# Patient Record
Sex: Female | Born: 2000
Health system: Southern US, Community
[De-identification: ages and names within clinical notes are randomized; demographics above are authoritative.]

## PROBLEM LIST (undated history)

## (undated) DIAGNOSIS — F40298 Other specified phobia: Secondary | ICD-10-CM

## (undated) DIAGNOSIS — R51 Headache: Secondary | ICD-10-CM

## (undated) DIAGNOSIS — F32A Depression, unspecified: Secondary | ICD-10-CM

## (undated) DIAGNOSIS — F419 Anxiety disorder, unspecified: Secondary | ICD-10-CM

## (undated) DIAGNOSIS — R519 Headache, unspecified: Secondary | ICD-10-CM

## (undated) DIAGNOSIS — N200 Calculus of kidney: Secondary | ICD-10-CM

## (undated) DIAGNOSIS — N390 Urinary tract infection, site not specified: Secondary | ICD-10-CM

## (undated) DIAGNOSIS — T7840XA Allergy, unspecified, initial encounter: Secondary | ICD-10-CM

## (undated) HISTORY — PX: FRACTURE SURGERY: SHX138

## (undated) HISTORY — DX: Other specified phobia: F40.298

## (undated) HISTORY — DX: Headache: R51

## (undated) HISTORY — DX: Depression, unspecified: F32.A

## (undated) HISTORY — DX: Allergy, unspecified, initial encounter: T78.40XA

## (undated) HISTORY — DX: Anxiety disorder, unspecified: F41.9

## (undated) HISTORY — DX: Headache, unspecified: R51.9

---
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
## 2021-01-05 ENCOUNTER — Encounter: Payer: Self-pay | Admitting: Family Medicine

## 2021-02-02 ENCOUNTER — Telehealth (INDEPENDENT_AMBULATORY_CARE_PROVIDER_SITE_OTHER): Payer: Self-pay | Admitting: Family

## 2021-02-02 ENCOUNTER — Encounter (INDEPENDENT_AMBULATORY_CARE_PROVIDER_SITE_OTHER): Payer: Self-pay

## 2021-02-02 NOTE — Telephone Encounter (Signed)
Who's calling (name and relationship to patient) : [facility]   Best contact number: [phone]  Provider they see: Elveria Rising  Reason for call: trokendi is no longer covered by insurance plan. Wants to know if toperimate is an option.  Reference # [id number]  Call ID:      PRESCRIPTION REFILL ONLY  Name of prescription:  Pharmacy:

## 2021-02-03 NOTE — Telephone Encounter (Signed)
I called and learned that Trokendi XR requires a PA. I did the PA with [employer] and it was approved. TG

## 2021-02-03 NOTE — Telephone Encounter (Signed)
Please advise 

## 2021-04-13 IMAGING — US US RENAL
1 series · 14 of 25 positions shown · non-contrast
Comparison: CT abdomen pelvis and abdominal ultrasound dated
01/10/2020.

CLINICAL DATA: Right-sided abdominal pain, history of renal stones.

EXAM:
RENAL / URINARY TRACT ULTRASOUND COMPLETE

[Series 1: us renal · 14 of 61 slices shown]
[im 1/61]
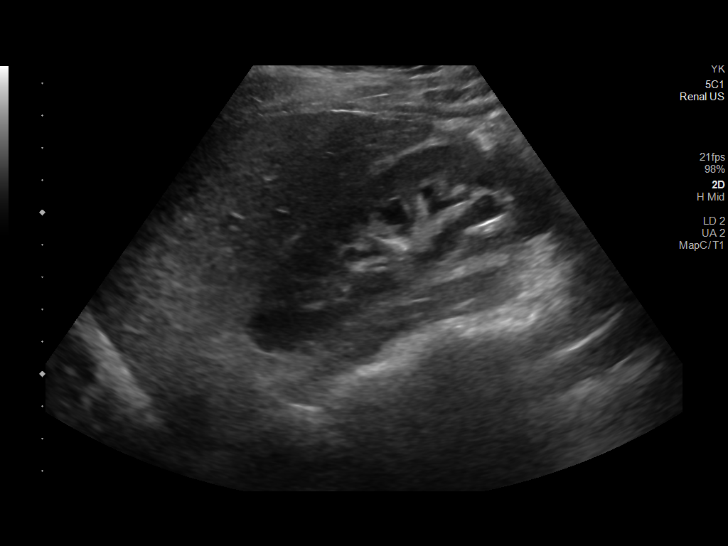
[im 6/61]
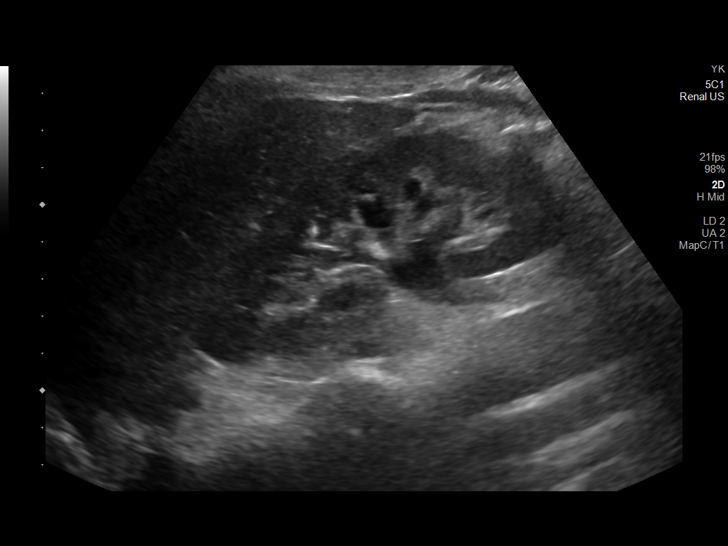
[im 11/61]
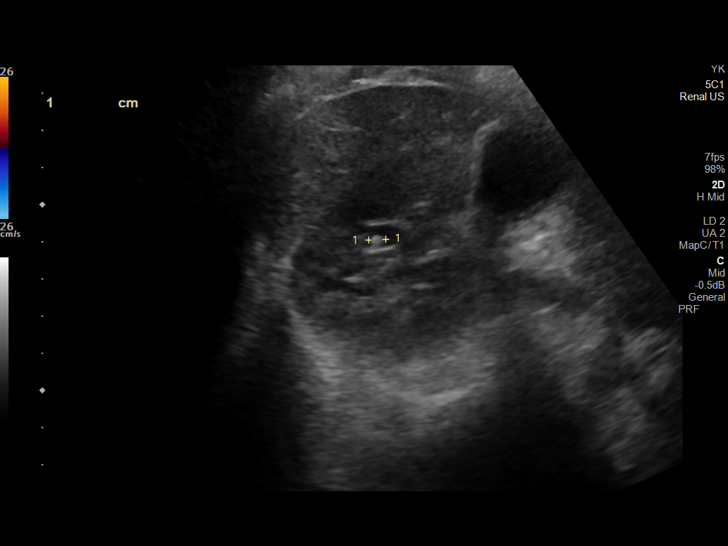
[im 16/61]
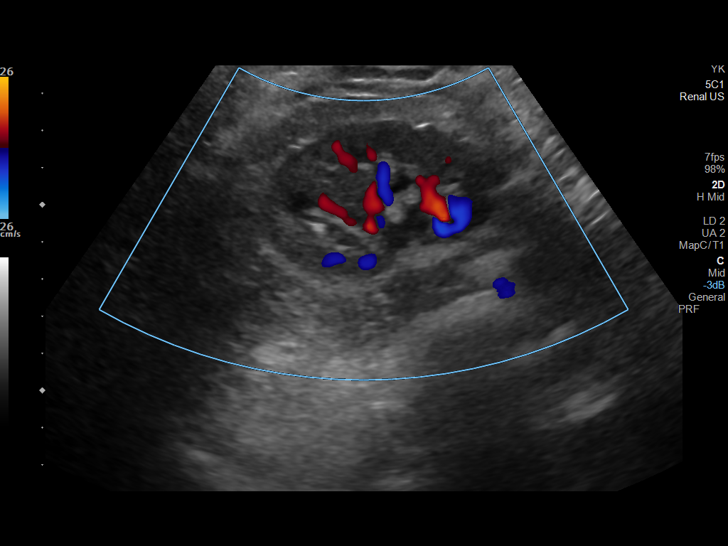
[im 21/61]
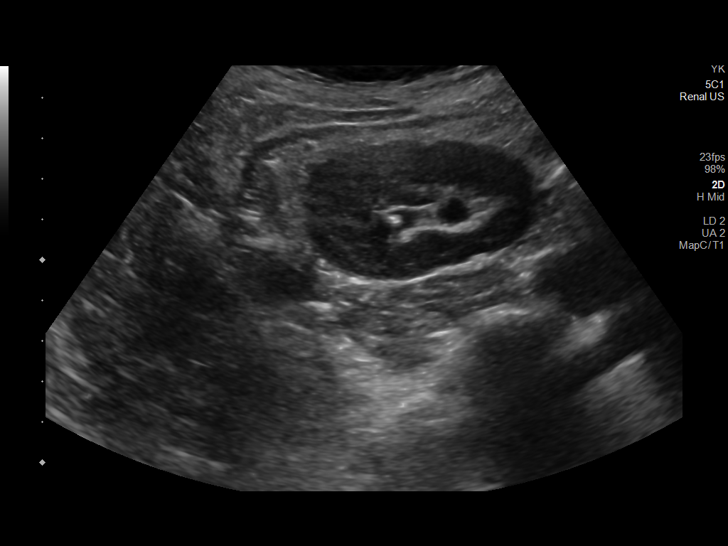
[im 23/61]
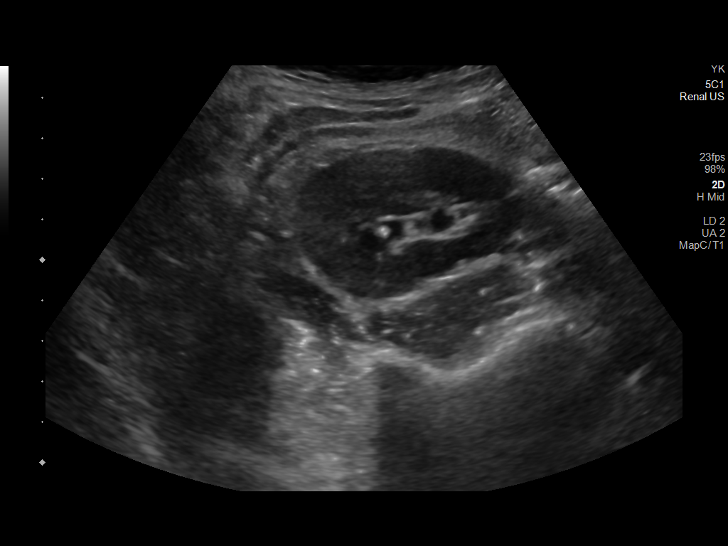
[im 28/61]
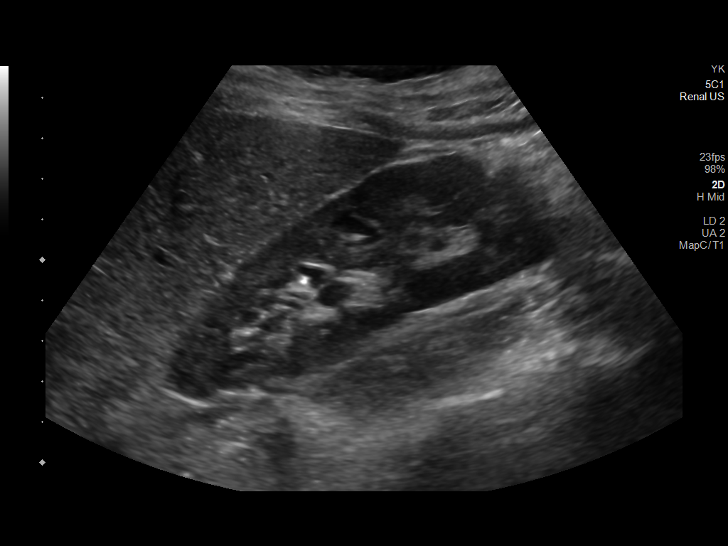
[im 33/61]
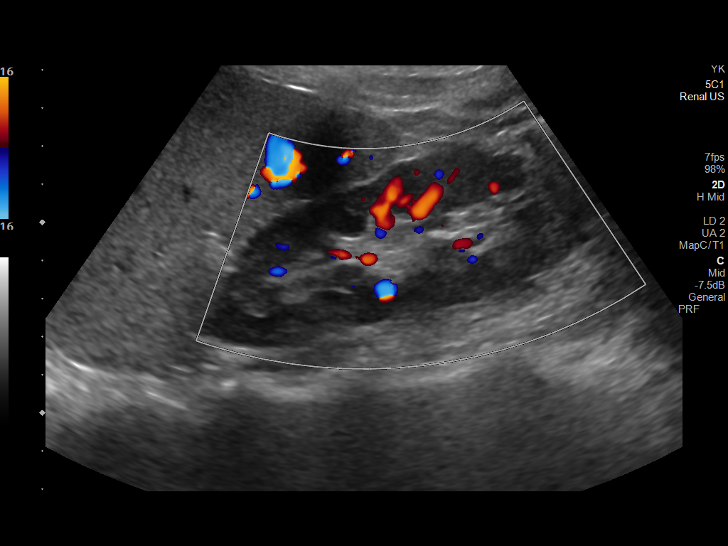
[im 38/61]
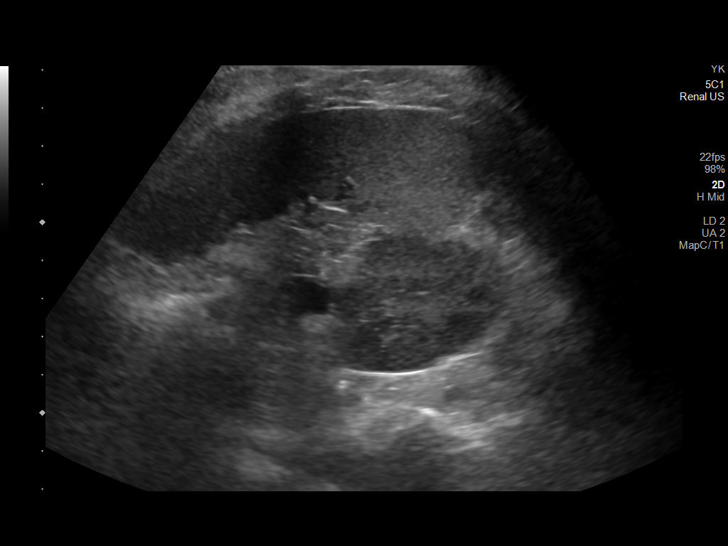
[im 41/61]
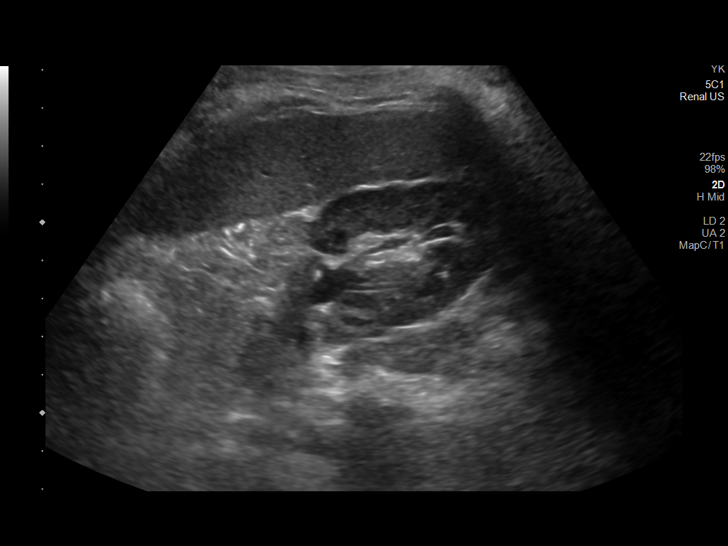
[im 46/61]
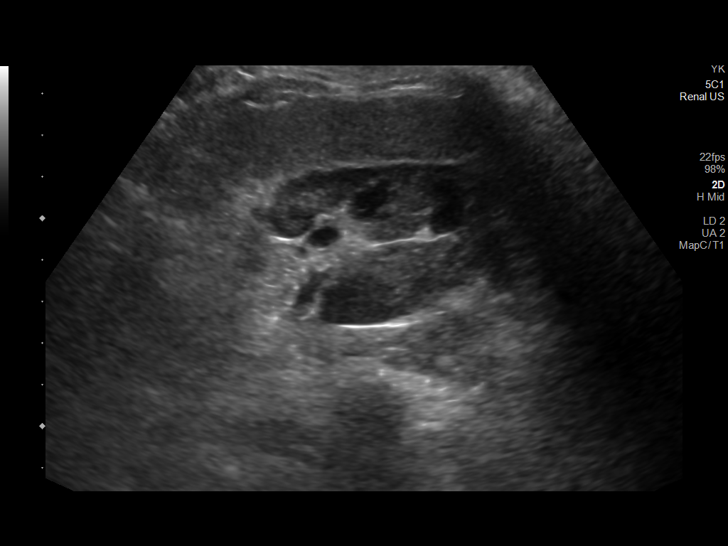
[im 51/61]
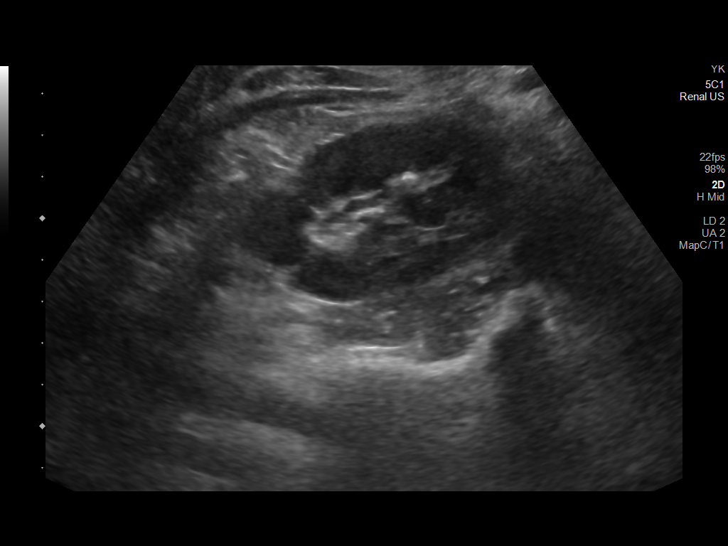
[im 56/61]
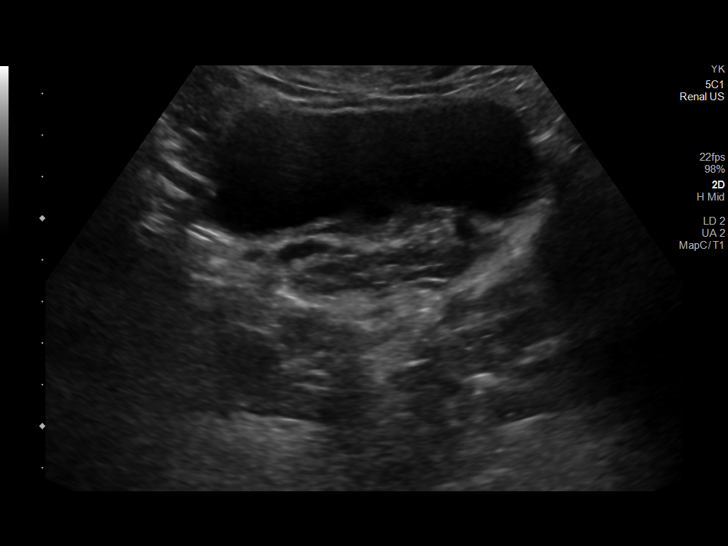
[im 61/61]
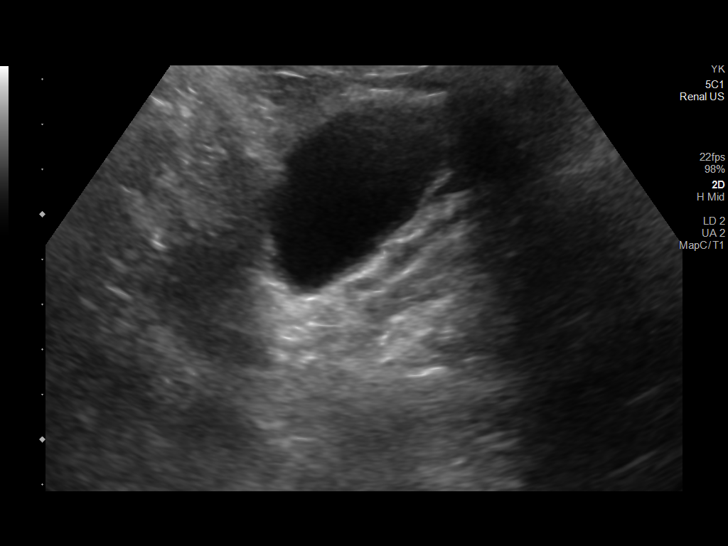

[14 of 25 positions shown; findings below may reference images not displayed]

FINDINGS: Right Kidney:

Renal measurements: 10.5 x 3.6 x 5.8 cm = volume: 109 mL.
Echogenicity within normal limits. Three shadowing calculi are noted
with the largest measuring 6 mm. No solid mass or hydronephrosis
visualized.

Left Kidney:

Renal measurements: 10.3 x 4.0 x 5.2 cm = volume: 107 mL.
Echogenicity within normal limits. A 4 mm calculus is seen. No solid
mass or hydronephrosis visualized.

Bladder:

Appears normal for degree of bladder distention.

Other:

None.
IMPRESSION: Bilateral nonobstructing renal calculi. No hydronephrosis.

## 2021-04-27 ENCOUNTER — Other Ambulatory Visit (INDEPENDENT_AMBULATORY_CARE_PROVIDER_SITE_OTHER): Payer: Self-pay | Admitting: Family

## 2021-04-27 DIAGNOSIS — G43009 Migraine without aura, not intractable, without status migrainosus: Secondary | ICD-10-CM

## 2021-04-28 ENCOUNTER — Encounter (INDEPENDENT_AMBULATORY_CARE_PROVIDER_SITE_OTHER): Payer: Self-pay | Admitting: Family

## 2021-04-28 NOTE — Telephone Encounter (Signed)
I called to schedule. Voicemail full, mailed letter. Tanya Higgins

## 2021-06-14 ENCOUNTER — Encounter: Payer: Self-pay | Admitting: Family Medicine

## 2021-06-14 ENCOUNTER — Other Ambulatory Visit: Payer: Self-pay

## 2021-06-14 ENCOUNTER — Telehealth (INDEPENDENT_AMBULATORY_CARE_PROVIDER_SITE_OTHER): Payer: BC Managed Care – PPO | Admitting: Family Medicine

## 2021-06-14 DIAGNOSIS — R110 Nausea: Secondary | ICD-10-CM | POA: Diagnosis not present

## 2021-06-14 MED ORDER — PROMETHAZINE HCL 12.5 MG PO TABS: 12.5000 mg | ORAL_TABLET | Freq: Four times a day (QID) | ORAL | 0 refills | Status: DC | PRN

## 2021-06-14 NOTE — Progress Notes (Signed)
Virtual Visit via Video Note  I connected with Tanya Higgins on 06/14/21 at  3:40 PM EDT by a video enabled telemedicine application and verified that I am speaking with the correct person using two identifiers.  Location: Patient: home Provider: [facility]   I discussed the limitations of evaluation and management by telemedicine and the availability of in person appointments. The patient expressed understanding and agreed to proceed.  History of Present Illness:  ABDOMINAL ISSUES - N/V, shaky, fatigue for the past few days - also happened about a month ago, episode self-resolved - no abd pain  Duration: days Frequency: nausea constant, severity waxes and wanes. Alleviating factors: rest Aggravating factors: eating in general Treatments attempted: zofran (helps temporarily) Constipation: no Diarrhea: no Mucous in the stool: no Heartburn: no Bloating:no Nausea: yes Vomiting: yes Episodes of vomit/day: once every few days for the past week Melena or hematochezia: no Rash: no Fever: no Weight loss: no No blood in urine or new back pain.  Never sexually active.  No change in headaches recently. Takes ibuprofen with headaches, last took this morning.     Observations/Objective:  Well appearing, in NAD.  Assessment and Plan:  Nausea Unclear etiology. Unlikely viral etiology given duration of symptoms and fluctuating nature of N/V and lack of diarrhea. Never sexually active, not pregnant. No change in headaches. Does have a h/o lack of appetite and on topamax for migraines, potentially contributing. Recommended inperson appt for labs and physical exam. Rx sent for phenergan.   I discussed the assessment and treatment plan with the patient. The patient was provided an opportunity to ask questions and all were answered. The patient agreed with the plan and demonstrated an understanding of the instructions.   The patient was advised to call back or seek an in-person evaluation  if the symptoms worsen or if the condition fails to improve as anticipated.  I provided 12 minutes of non-face-to-face time during this encounter.   Caro Laroche, DO

## 2021-06-14 NOTE — Patient Instructions (Signed)
It was great to see you!  Our plans for today:  - We sent a nausea medication to your pharmacy. - Come by for an in-person appointment for a physical exam and lab work.  - If your symptoms worsen or you are unable to keep down fluids, go to the Emergency Department.  Take care and seek immediate care sooner if you develop any concerns.   Dr. Linwood Dibbles

## 2021-06-18 ENCOUNTER — Encounter: Payer: Self-pay | Admitting: Family Medicine

## 2021-06-21 ENCOUNTER — Ambulatory Visit: Payer: BC Managed Care – PPO | Admitting: Family Medicine

## 2021-06-21 ENCOUNTER — Other Ambulatory Visit: Payer: Self-pay

## 2021-06-21 ENCOUNTER — Ambulatory Visit (INDEPENDENT_AMBULATORY_CARE_PROVIDER_SITE_OTHER): Payer: BC Managed Care – PPO | Admitting: Nurse Practitioner

## 2021-06-21 VITALS — BP 112/70 | HR 86 | Temp 98.1°F | Resp 16 | Ht 64.0 in | Wt 108.0 lb

## 2021-06-21 DIAGNOSIS — F419 Anxiety disorder, unspecified: Secondary | ICD-10-CM | POA: Diagnosis not present

## 2021-06-21 DIAGNOSIS — G43009 Migraine without aura, not intractable, without status migrainosus: Secondary | ICD-10-CM

## 2021-06-21 DIAGNOSIS — F339 Major depressive disorder, recurrent, unspecified: Secondary | ICD-10-CM

## 2021-06-21 DIAGNOSIS — F5104 Psychophysiologic insomnia: Secondary | ICD-10-CM

## 2021-06-21 MED ORDER — BUSPIRONE HCL 5 MG PO TABS: 5.0000 mg | ORAL_TABLET | Freq: Two times a day (BID) | ORAL | 2 refills | Status: DC

## 2021-06-21 MED ORDER — HYDROXYZINE HCL 10 MG PO TABS: 10.0000 mg | ORAL_TABLET | Freq: Three times a day (TID) | ORAL | 0 refills | Status: DC | PRN

## 2021-06-21 MED ORDER — ARIPIPRAZOLE 10 MG PO TABS: 10.0000 mg | ORAL_TABLET | Freq: Every day | ORAL | 1 refills | Status: DC

## 2021-06-21 MED ORDER — TROKENDI XR 50 MG PO CP24
ORAL_CAPSULE | ORAL | 3 refills | Status: DC
Start: 2021-06-21 — End: 2021-12-04

## 2021-06-21 NOTE — Progress Notes (Signed)
BP 112/70   Pulse 86   Temp 98.1 F (36.7 C)   Resp 16   Ht 5\' 4"  (1.626 m)   Wt 108 lb (49 kg)   SpO2 98%   BMI 18.54 kg/m    Subjective:    Patient ID: , female    DOB: September 16, 2001, 20 y.o.   MRN: [id number]  HPI: Tanya Higgins is a 20 y.o. female, here with mom  Chief Complaint  Patient presents with   Medication Refill    Depression:  She is currently taking Abilify 10 mg, Cymbalta 60 mg.  She says her medications help with the depression. She is not currently in therapy.  Her mother agrees that she is doing well with her mood. PHQ9 is 4 today.  Will refill medication.   Anxiety: She says she has waves of anxiety.  She is currently taking Buspar 5 mg and hydroxyzine 10 mg.  She says the medication helps.  She is still picking at her fingers.  Discussed getting an anxiety ring to help with the picking.  GAD score is 0 today.   Insomnia: she says she is currently not having trouble sleeping.  She says she sleeps through the night without a problem.   Migraines:  She says she has had 1 episode this month.  Last saw Neurology 12/2019.  Her migraines sometimes is associated with nausea, or aura.  She can also experience sensitivity to light and sound.  She is currently taking Trokendi XR 50 mg.  She says this medication has helped her so much.    Depression screen [facility] 2/9 06/21/2021 06/14/2021 06/11/2019 02/12/2019 01/08/2019  Decreased Interest 1 0 1 1 3   Down, Depressed, Hopeless 0 0 1 1 1   PHQ - 2 Score 1 0 2 2 4   Altered sleeping 0 0 0 2 3  Tired, decreased energy 3 1 3 2 3   Change in appetite 0 1 2 3 1   Feeling bad or failure about yourself  0 0 2 1 0  Trouble concentrating 0 0 2 0 0  Moving slowly or fidgety/restless 0 0 0 0 0  Suicidal thoughts 0 0 1 1 0  PHQ-9 Score 4 2 12 11 11   Difficult doing work/chores - Not difficult at all Very difficult Very difficult -  Some recent data might be hidden    GAD 7 : Generalized Anxiety Score 06/21/2021 06/14/2021  06/11/2019 10/17/2018  Nervous, Anxious, on Edge 0 0 3 3  Control/stop worrying 0 0 3 2  Worry too much - different things 0 0 3 3  Trouble relaxing 0 0 0 1  Restless 0 0 0 0  Easily annoyed or irritable 0 0 0 0  Afraid - awful might happen 0 0 1 1  Total GAD 7 Score 0 0 10 10  Anxiety Difficulty - Not difficult at all Extremely difficult Somewhat difficult     Relevant past medical, surgical, family and social history reviewed and updated as indicated. Interim medical history since our last visit reviewed. Allergies and medications reviewed and updated.  Review of Systems  Constitutional: Negative for fever or weight change.  Respiratory: Negative for cough and shortness of breath.   Cardiovascular: Negative for chest pain or palpitations.  Gastrointestinal: Negative for abdominal pain, no bowel changes.  Musculoskeletal: Negative for gait problem or joint swelling.  Skin: Negative for rash.  Neurological: Negative for dizziness, positive for headache.  No other specific complaints in a complete review  of systems (except as listed in HPI above).      Objective:    BP 112/70   Pulse 86   Temp 98.1 F (36.7 C)   Resp 16   Ht 5\' 4"  (1.626 m)   Wt 108 lb (49 kg)   SpO2 98%   BMI 18.54 kg/m   Wt Readings from Last 3 Encounters:  06/21/21 108 lb (49 kg) (13 %, Z= -1.12)*  10/30/20 110 lb (49.9 kg) (19 %, Z= -0.89)*  01/21/20 98 lb 12.8 oz (44.8 kg) (4 %, Z= -1.70)*   * Growth percentiles are based on CDC (Girls, 2-20 Years) data.    Physical Exam  Constitutional: Patient appears well-developed and well-nourished. No distress.  HEENT: head atraumatic, normocephalic, pupils equal and reactive to light, neck supple Cardiovascular: Normal rate, regular rhythm and normal heart sounds.  No murmur heard. No BLE edema. Pulmonary/Chest: Effort normal and breath sounds normal. No respiratory distress. Abdominal: Soft.  There is no tenderness. Psychiatric: Patient has a normal mood  and affect. behavior is normal. Judgment and thought content normal.   Results for orders placed or performed during the hospital encounter of 10/30/20  Urine Culture   Specimen: Urine, Clean Catch  Result Value Ref Range   Specimen Description URINE, CLEAN CATCH    Special Requests NONE    Culture (A)     <10,000 COLONIES/mL INSIGNIFICANT GROWTH Performed at [facility], [address], [place], [place]    Report Status 10/31/2020 FINAL   Lipase, blood  Result Value Ref Range   Lipase 75 (H) 11 - 51 U/L  Comprehensive metabolic panel  Result Value Ref Range   Sodium 138 135 - 145 mmol/L   Potassium 3.9 3.5 - 5.1 mmol/L   Chloride 107 98 - 111 mmol/L   CO2 22 22 - 32 mmol/L   Glucose, Bld 113 (H) 70 - 99 mg/dL   BUN 7 6 - 20 mg/dL   Creatinine, Ser 11/02/2020 0.44 - 1.00 mg/dL   Calcium 9.1 8.9 - 3.14 mg/dL   Total Protein 6.4 (L) 6.5 - 8.1 g/dL   Albumin 4.4 3.5 - 5.0 g/dL   AST 13 (L) 15 - 41 U/L   ALT 13 0 - 44 U/L   Alkaline Phosphatase 78 38 - 126 U/L   Total Bilirubin 0.6 0.3 - 1.2 mg/dL   GFR, Estimated 97.0 >26 mL/min   Anion gap 9 5 - 15  CBC  Result Value Ref Range   WBC 8.5 4.0 - 10.5 K/uL   RBC 4.51 3.87 - 5.11 MIL/uL   Hemoglobin 13.3 12.0 - 15.0 g/dL   HCT >37 85.8 - 85.0 %   MCV 89.6 80.0 - 100.0 fL   MCH 29.5 26.0 - 34.0 pg   MCHC 32.9 30.0 - 36.0 g/dL   RDW 27.7 41.2 - 87.8 %   Platelets 265 150 - 400 K/uL   nRBC 0.0 0.0 - 0.2 %  Urinalysis, Routine w reflex microscopic Urine, Clean Catch  Result Value Ref Range   Color, Urine RED (A) YELLOW   APPearance TURBID (A) CLEAR   Specific Gravity, Urine  1.005 - 1.030    TEST NOT REPORTED DUE TO COLOR INTERFERENCE OF URINE PIGMENT   pH  5.0 - 8.0    TEST NOT REPORTED DUE TO COLOR INTERFERENCE OF URINE PIGMENT   Glucose, UA (A) NEGATIVE mg/dL    TEST NOT REPORTED DUE TO COLOR INTERFERENCE OF URINE PIGMENT  Hgb urine dipstick (A) NEGATIVE    TEST NOT REPORTED DUE TO COLOR INTERFERENCE OF  URINE PIGMENT   Bilirubin Urine (A) NEGATIVE    TEST NOT REPORTED DUE TO COLOR INTERFERENCE OF URINE PIGMENT   Ketones, ur (A) NEGATIVE mg/dL    TEST NOT REPORTED DUE TO COLOR INTERFERENCE OF URINE PIGMENT   Protein, ur (A) NEGATIVE mg/dL    TEST NOT REPORTED DUE TO COLOR INTERFERENCE OF URINE PIGMENT   Nitrite (A) NEGATIVE    TEST NOT REPORTED DUE TO COLOR INTERFERENCE OF URINE PIGMENT   Leukocytes,Ua (A) NEGATIVE    TEST NOT REPORTED DUE TO COLOR INTERFERENCE OF URINE PIGMENT  Urinalysis, Microscopic (reflex)  Result Value Ref Range   RBC / HPF >50 0 - 5 RBC/hpf   WBC, UA 6-10 0 - 5 WBC/hpf   Bacteria, UA FEW (A) NONE SEEN   Squamous Epithelial / LPF 0-5 0 - 5  I-Stat beta hCG blood, ED  Result Value Ref Range   I-stat hCG, quantitative <5.0 <5 mIU/mL   Comment 3              Assessment & Plan:   1. Major depression, recurrent, chronic (HCC)  - ARIPiprazole (ABILIFY) 10 MG tablet; Take 1 tablet (10 mg total) by mouth daily.  Dispense: 90 tablet; Refill: 1 - busPIRone (BUSPAR) 5 MG tablet; Take 1 tablet (5 mg total) by mouth 2 (two) times daily.  Dispense: 180 tablet; Refill: 2  2. Anxiety   hydrOXYzine (ATARAX/VISTARIL) 10 MG tablet; Take 1 tablet (10 mg total) by mouth every 8 (eight) hours as needed.  Dispense: 270 tablet; Refill: 0 3. Psychophysiological insomnia  - hydrOXYzine (ATARAX/VISTARIL) 10 MG tablet; Take 1 tablet (10 mg total) by mouth every 8 (eight) hours as needed.  Dispense: 270 tablet; Refill: 0  4. Migraine without aura and without status migrainosus, not intractable  - TROKENDI XR 50 MG CP24; Take 1 at bedtime  Dispense: 90 capsule; Refill: 3   Follow up plan: Return in about 3 months (around 09/20/2021) for follow up.

## 2021-06-25 ENCOUNTER — Encounter: Payer: Self-pay | Admitting: Family Medicine

## 2021-06-26 ENCOUNTER — Other Ambulatory Visit: Payer: Self-pay | Admitting: Nurse Practitioner

## 2021-06-26 DIAGNOSIS — F339 Major depressive disorder, recurrent, unspecified: Secondary | ICD-10-CM

## 2021-06-26 DIAGNOSIS — F5104 Psychophysiologic insomnia: Secondary | ICD-10-CM

## 2021-06-26 MED ORDER — BUSPIRONE HCL 15 MG PO TABS: 15.0000 mg | ORAL_TABLET | Freq: Two times a day (BID) | ORAL | 1 refills | Status: DC

## 2021-06-26 MED ORDER — HYDROXYZINE HCL 25 MG PO TABS: 25.0000 mg | ORAL_TABLET | Freq: Three times a day (TID) | ORAL | 1 refills | Status: DC | PRN

## 2021-06-26 MED ORDER — ARIPIPRAZOLE 5 MG PO TABS: 5.0000 mg | ORAL_TABLET | Freq: Every day | ORAL | 1 refills | Status: DC

## 2021-10-08 ENCOUNTER — Encounter: Payer: Self-pay | Admitting: Nurse Practitioner

## 2021-10-16 ENCOUNTER — Other Ambulatory Visit: Payer: Self-pay

## 2021-10-16 DIAGNOSIS — F339 Major depressive disorder, recurrent, unspecified: Secondary | ICD-10-CM

## 2021-10-16 MED ORDER — BUSPIRONE HCL 15 MG PO TABS: 15.0000 mg | ORAL_TABLET | Freq: Two times a day (BID) | ORAL | 1 refills | Status: DC

## 2021-11-20 ENCOUNTER — Ambulatory Visit: Payer: BC Managed Care – PPO | Admitting: Nurse Practitioner

## 2021-12-02 ENCOUNTER — Encounter: Payer: Self-pay | Admitting: Nurse Practitioner

## 2021-12-04 ENCOUNTER — Other Ambulatory Visit: Payer: Self-pay

## 2021-12-04 ENCOUNTER — Other Ambulatory Visit: Payer: Self-pay | Admitting: Nurse Practitioner

## 2021-12-04 DIAGNOSIS — G43009 Migraine without aura, not intractable, without status migrainosus: Secondary | ICD-10-CM

## 2021-12-04 MED ORDER — TROKENDI XR 50 MG PO CP24: ORAL_CAPSULE | ORAL | 0 refills | Status: DC

## 2022-01-10 ENCOUNTER — Encounter: Payer: Self-pay | Admitting: Family Medicine

## 2022-01-10 ENCOUNTER — Ambulatory Visit (INDEPENDENT_AMBULATORY_CARE_PROVIDER_SITE_OTHER): Payer: BC Managed Care – PPO | Admitting: Family Medicine

## 2022-01-10 VITALS — BP 106/62 | HR 112 | Temp 98.1°F | Resp 16 | Wt 124.0 lb

## 2022-01-10 DIAGNOSIS — K921 Melena: Secondary | ICD-10-CM | POA: Insufficient documentation

## 2022-01-10 LAB — HEMOCCULT GUIAC POC 1CARD (OFFICE): Fecal Occult Blood, POC: POSITIVE — AB

## 2022-01-10 NOTE — Assessment & Plan Note (Signed)
Unclear etiology. Benign abdominal exam. Rectal exam notable for frank blood, FOBT positive. Unable to perform full anal exam as no anoscope in the office, no internal hemorrhoids palpated on exam. Obtaining labs to assess for infectious source and potential IBD given family history. Recommend miralax to achieve soft, regular stools, titrate for effect. Follow up pending labs. ?

## 2022-01-10 NOTE — Progress Notes (Signed)
? ?  SUBJECTIVE:  ? ?CHIEF COMPLAINT / HPI:  ? ?ABDOMINAL ISSUES ?- endorsing belly pain and blood in stool few days ago. Now feeling weak ?- never happened before ?- LMP today ?- grandmother with Crohn's and great aunt with Celiac. ?- denies new or suspicious foods. No recent travel. ? ?Duration: few days ?Nature: cramps but a little different, achy ?Location: lower abdomen, bilateral  ?Radiation: no ?Frequency: intermittent  ?Alleviating factors: none ?Aggravating factors: none ?Treatments attempted: stool softener ?No straining with BM, type 1-2 on BSC. ?Blood with wiping and in toilet, unsure if mixed in. Reddish brown. No black stools.  ?Constipation: no ?Diarrhea: no ?Heartburn: no ?Bloating:no ?Flatulence: yes a week ago ?Nausea: yes ?Vomiting: no ?Melena or hematochezia: yes, Bright red ?Rash: no ?Jaundice: no ?Fever: no ?Weight loss: unsure ? ? ?OBJECTIVE:  ? ?BP 106/62   Pulse (!) 112   Temp 98.1 ?F (36.7 ?C)   Resp 16   Wt 124 lb (56.2 kg)   LMP 01/09/2022   SpO2 100%   BMI 21.28 kg/m?   ?Gen: well appearing, in NAD ?Card: RRR ?Lungs: CTAB ?Abd: soft, NTND, minimal BS. No organomegaly. Negative murphy sign. No tenderness over McBurney's point. No rebound tenderness. ?Rectal exam: no tenderness noted, sphincter tone normal, stool guaiac positive. Non-thrombosed external hemorrhoid at 6 o clock noted. ?Ext: WWP, no edema ? ? ?ASSESSMENT/PLAN:  ? ?Blood in stool ?Unclear etiology. Benign abdominal exam. Rectal exam notable for frank blood, FOBT positive. Unable to perform full anal exam as no anoscope in the office, no internal hemorrhoids palpated on exam. Obtaining labs to assess for infectious source and potential IBD given family history. Recommend miralax to achieve soft, regular stools, titrate for effect. Follow up pending labs. ?  ? ? ?Caro Laroche, DO ?

## 2022-01-11 LAB — CBC WITH DIFFERENTIAL/PLATELET
Absolute Monocytes: 770 cells/uL (ref 200–950)
Basophils Absolute: 22 cells/uL (ref 0–200)
Basophils Relative: 0.3 %
Eosinophils Absolute: 58 cells/uL (ref 15–500)
Eosinophils Relative: 0.8 %
HCT: 38.4 % (ref 35.0–45.0)
Hemoglobin: 13 g/dL (ref 11.7–15.5)
Lymphs Abs: 1908 cells/uL (ref 850–3900)
MCH: 29.1 pg (ref 27.0–33.0)
MCHC: 33.9 g/dL (ref 32.0–36.0)
MCV: 85.9 fL (ref 80.0–100.0)
MPV: 10 fL (ref 7.5–12.5)
Monocytes Relative: 10.7 %
Neutro Abs: 4442 cells/uL (ref 1500–7800)
Neutrophils Relative %: 61.7 %
Platelets: 238 10*3/uL (ref 140–400)
RBC: 4.47 10*6/uL (ref 3.80–5.10)
RDW: 13.1 % (ref 11.0–15.0)
Total Lymphocyte: 26.5 %
WBC: 7.2 10*3/uL (ref 3.8–10.8)

## 2022-01-11 LAB — SEDIMENTATION RATE: Sed Rate: 2 mm/h (ref 0–20)

## 2022-01-11 LAB — CELIAC DISEASE PANEL
(tTG) Ab, IgA: <1 U/mL
(tTG) Ab, IgG: <1 U/mL
Gliadin IgA: 1.4 U/mL
Gliadin IgG: <1 U/mL
Immunoglobulin A: 55 mg/dL (ref 47–310)

## 2022-01-11 LAB — COMPREHENSIVE METABOLIC PANEL
AG Ratio: 2.9 (calc) — ABNORMAL HIGH (ref 1.0–2.5)
ALT: 11 U/L (ref 5–32)
AST: 13 U/L (ref 12–32)
Albumin: 4.3 g/dL (ref 3.6–5.1)
Alkaline phosphatase (APISO): 64 U/L (ref 36–128)
BUN: 10 mg/dL (ref 7–20)
CO2: 25 mmol/L (ref 20–32)
Calcium: 9 mg/dL (ref 8.9–10.4)
Chloride: 108 mmol/L (ref 98–110)
Creat: 0.62 mg/dL (ref 0.50–0.96)
Globulin: 1.5 g/dL (calc) — ABNORMAL LOW (ref 2.0–3.8)
Glucose, Bld: 87 mg/dL (ref 65–99)
Potassium: 4.1 mmol/L (ref 3.8–5.1)
Sodium: 140 mmol/L (ref 135–146)
Total Bilirubin: 0.6 mg/dL (ref 0.2–1.1)
Total Protein: 5.8 g/dL — ABNORMAL LOW (ref 6.3–8.2)

## 2022-01-11 LAB — C-REACTIVE PROTEIN: CRP: 1.1 mg/L (ref <8.0)

## 2022-01-18 ENCOUNTER — Ambulatory Visit: Payer: BC Managed Care – PPO | Admitting: Nurse Practitioner

## 2022-01-24 ENCOUNTER — Telehealth (INDEPENDENT_AMBULATORY_CARE_PROVIDER_SITE_OTHER): Payer: BC Managed Care – PPO | Admitting: Nurse Practitioner

## 2022-01-24 ENCOUNTER — Encounter: Payer: Self-pay | Admitting: Nurse Practitioner

## 2022-01-24 DIAGNOSIS — F330 Major depressive disorder, recurrent, mild: Secondary | ICD-10-CM

## 2022-01-24 DIAGNOSIS — F5104 Psychophysiologic insomnia: Secondary | ICD-10-CM

## 2022-01-24 DIAGNOSIS — F419 Anxiety disorder, unspecified: Secondary | ICD-10-CM

## 2022-01-24 MED ORDER — HYDROXYZINE HCL 25 MG PO TABS: 25.0000 mg | ORAL_TABLET | Freq: Three times a day (TID) | ORAL | 3 refills | Status: DC | PRN

## 2022-01-24 NOTE — Progress Notes (Signed)
? ?Name: Tanya Higgins   MRN: [id number]    DOB: 01/30/2001   Date:01/24/2022 ? ?     Progress Note ? ?Subjective ? ?Chief Complaint ? ?Chief Complaint  ?Patient presents with  ? Follow-up  ? Medication Refill  ? Anxiety  ? ? ?I connected with  Tanya Higgins  on 01/24/22 at 2:27 by a video enabled telemedicine application and verified that I am speaking with the correct person using two identifiers.  I discussed the limitations of evaluation and management by telemedicine and the availability of in person appointments. The patient expressed understanding and agreed to proceed with a virtual visit  Staff also discussed with the patient that there may be a patient responsible charge related to this service. ?Patient Location: home ?Provider Location: [facility] ?Additional Individuals present: alone ? ?HPI ? ?Anxiety/depression/insomnia: She says her mood has been pretty good lately.  She says the hydroxyzine has been helping her sleep.She is currently taking hydroxyzine, BuSpar, Cymbalta and Abilify.  She denies any suicidal thoughts.  She says she is doing well on current regimen.  We will continue with current treatment plan. ? ?  01/24/2022  ?  2:02 PM 01/10/2022  ?  3:57 PM 06/21/2021  ?  2:18 PM 06/14/2021  ?  1:28 PM 06/11/2019  ?  9:48 AM  ?Depression screen PHQ 2/9  ?Decreased Interest 0 0 1 0 1  ?Down, Depressed, Hopeless 0 0 0 0 1  ?PHQ - 2 Score 0 0 1 0 2  ?Altered sleeping 2 0 0 0 0  ?Tired, decreased energy 3 0 3 1 3   ?Change in appetite 0 0 0 1 2  ?Feeling bad or failure about yourself  0 0 0 0 2  ?Trouble concentrating 1 0 0 0 2  ?Moving slowly or fidgety/restless 0 0 0 0 0  ?Suicidal thoughts 0 0 0 0 1  ?PHQ-9 Score 6 0 4 2 12   ?Difficult doing work/chores Somewhat difficult Not difficult at all  Not difficult at all Very difficult  ?  ? ?  01/24/2022  ?  2:03 PM 06/21/2021  ?  2:45 PM 06/14/2021  ?  1:29 PM 06/11/2019  ?  9:50 AM  ?GAD 7 : Generalized Anxiety Score  ?Nervous, Anxious, on Edge 1 0 0 3   ?Control/stop worrying 0 0 0 3  ?Worry too much - different things 0 0 0 3  ?Trouble relaxing 1 0 0 0  ?Restless 3 0 0 0  ?Easily annoyed or irritable 0 0 0 0  ?Afraid - awful might happen 1 0 0 1  ?Total GAD 7 Score 6 0 0 10  ?Anxiety Difficulty Not difficult at all  Not difficult at all Extremely difficult  ? ?  ? ?Patient Active Problem List  ? Diagnosis Date Noted  ? Blood in stool 01/10/2022  ? Hallucinations 06/19/2019  ? Loss of weight 06/19/2019  ? Panic 02/10/2018  ? Tension headache 08/16/2017  ? Migraine without aura and without status migrainosus, not intractable 08/14/2017  ? Major depression, recurrent, chronic (HCC) 08/14/2017  ? Allergic rhinitis, seasonal 06/17/2015  ? Anxiety 05/07/2015  ? Picky eater 05/07/2015  ? Isolated phobia 05/07/2015  ? ? ?Social History  ? ?Tobacco Use  ? Smoking status: Never  ? Smokeless tobacco: Never  ?Substance Use Topics  ? Alcohol use: No  ?  Alcohol/week: 0.0 standard drinks  ? ? ? ?Current Outpatient Medications:  ?  ARIPiprazole (ABILIFY) 5 MG tablet, Take  1 tablet (5 mg total) by mouth daily., Disp: 90 tablet, Rfl: 1 ?  busPIRone (BUSPAR) 15 MG tablet, Take 1 tablet (15 mg total) by mouth 2 (two) times daily., Disp: 180 tablet, Rfl: 1 ?  DULoxetine (CYMBALTA) 60 MG capsule, Take 60 mg by mouth daily., Disp: , Rfl:  ?  hydrOXYzine (ATARAX/VISTARIL) 25 MG tablet, Take 1 tablet (25 mg total) by mouth every 8 (eight) hours as needed., Disp: 90 tablet, Rfl: 1 ?  TROKENDI XR 50 MG CP24, Take 1 at bedtime, Disp: 90 capsule, Rfl: 0 ? ?No Known Allergies ? ?I personally reviewed active problem list, medication list, allergies, notes from last encounter with the patient/caregiver today. ? ?ROS ? ?Constitutional: Negative for fever or weight change.  ?Respiratory: Negative for cough and shortness of breath.   ?Cardiovascular: Negative for chest pain or palpitations.  ?Gastrointestinal: Negative for abdominal pain, no bowel changes.  ?Musculoskeletal: Negative for gait  problem or joint swelling.  ?Skin: Negative for rash.  ?Neurological: Negative for dizziness or headache.  ?No other specific complaints in a complete review of systems (except as listed in HPI above).  ? ?Objective ? ?Virtual encounter, vitals not obtained. ? ?There is no height or weight on file to calculate BMI. ? ?Nursing Note and Vital Signs reviewed. ? ?Physical Exam ? ?Awake, alert, oriented x3, speaking in complete sentences ? ?No results found for this or any previous visit (from the past 72 hour(s)). ? ?Assessment & Plan ? ?1. Anxiety ? ?Continue taking Abilify, Cymbalta BuSpar and hydroxyzine ? ?2. Mild episode of recurrent major depressive disorder (HCC) ?Continue taking Abilify, Cymbalta BuSpar and hydroxyzine ? ?3. Psychophysiological insomnia ? ?- hydrOXYzine (ATARAX) 25 MG tablet; Take 1 tablet (25 mg total) by mouth every 8 (eight) hours as needed.  Dispense: 90 tablet; Refill: 3  ? ?-Red flags and when to present for emergency care or RTC including fever >101.80F, chest pain, shortness of breath, new/worsening/un-resolving symptoms,  reviewed with patient at time of visit. Follow up and care instructions discussed and provided in AVS. ?- I discussed the assessment and treatment plan with the patient. The patient was provided an opportunity to ask questions and all were answered. The patient agreed with the plan and demonstrated an understanding of the instructions. ? ?I provided 15 minutes of non-face-to-face time during this encounter. ? ?Berniece Salines, FNP ? ?  ?

## 2022-01-25 ENCOUNTER — Other Ambulatory Visit: Payer: Self-pay | Admitting: Family Medicine

## 2022-01-25 DIAGNOSIS — K921 Melena: Secondary | ICD-10-CM | POA: Diagnosis not present

## 2022-01-30 ENCOUNTER — Other Ambulatory Visit: Payer: Self-pay | Admitting: Nurse Practitioner

## 2022-01-30 DIAGNOSIS — G43009 Migraine without aura, not intractable, without status migrainosus: Secondary | ICD-10-CM

## 2022-01-31 ENCOUNTER — Other Ambulatory Visit: Payer: Self-pay | Admitting: Nurse Practitioner

## 2022-01-31 DIAGNOSIS — F339 Major depressive disorder, recurrent, unspecified: Secondary | ICD-10-CM

## 2022-01-31 MED ORDER — TROKENDI XR 50 MG PO CP24
ORAL_CAPSULE | ORAL | 0 refills | Status: DC
Start: 2022-01-31 — End: 2022-05-11

## 2022-01-31 MED ORDER — DULOXETINE HCL 60 MG PO CPEP: 60.0000 mg | ORAL_CAPSULE | Freq: Every day | ORAL | 1 refills | Status: DC

## 2022-01-31 NOTE — Telephone Encounter (Signed)
Requested medication (s) are due for refill today: yes ? ?Requested medication (s) are on the active medication list: yes ? ?Last refill:  06/26/21 #90 1 RF ? ?Future visit scheduled: no ? ?Notes to clinic:  med not delegated to NT to RF ? ? ?Requested Prescriptions  ?Pending Prescriptions Disp Refills  ? ARIPiprazole (ABILIFY) 5 MG tablet [Pharmacy Med Name: ARIPiprazole 5 MG TABLET] 90 tablet 1  ?  Sig: TAKE 1 TABLET BY MOUTH DAILY.  ?  ? Not Delegated - Psychiatry:  Antipsychotics - Second Generation (Atypical) - aripiprazole Failed - 01/31/2022  9:33 AM  ?  ?  Failed - This refill cannot be delegated  ?  ?  Failed - TSH in normal range and within 360 days  ?  TSH  ?Date Value Ref Range Status  ?08/01/2018 1.58 mIU/L Final  ?  Comment:  ?             Reference Range ?. ?           1-19 Years 0.50-4.30 ?Marland Kitchen ?               Pregnancy Ranges ?           First trimester   0.26-2.66 ?           Second trimester  0.55-2.73 ?           Third trimester   0.43-2.91 ?  ?  ?  ?  ?  Failed - Last Heart Rate in normal range  ?  Pulse Readings from Last 1 Encounters:  ?01/10/22 (!) 112  ?  ?  ?  ?  Failed - Lipid Panel in normal range within the last 12 months  ?  Cholesterol  ?Date Value Ref Range Status  ?08/01/2018 140 <170 mg/dL Final  ? ?No results found for: LDLCALC, LDLC, HIRISKLDL, POCLDL, LDLDIRECT, REALLDLC, TOTLDLC ?No results found for: HDL, POCHDL ?No results found for: TRIG, POCTRIG ?  ?  ?  Passed - Completed PHQ-2 or PHQ-9 in the last 360 days  ?  ?  Passed - Last BP in normal range  ?  BP Readings from Last 1 Encounters:  ?01/10/22 106/62  ?  ?  ?  ?  Passed - Valid encounter within last 6 months  ?  Recent Outpatient Visits   ? ?      ? 1 week ago Anxiety  ? [place], FNP  ? 3 weeks ago Blood in stool  ? [place], DO  ? 7 months ago Major depression, recurrent, chronic ([place])  ? [facility] Bo Merino, FNP  ? 7  months ago Nausea  ? [place], DO  ? 2 years ago Migraine without aura and without status migrainosus, not intractable  ? [facility] [place], Drue Stager, MD  ? ?  ?  ? ? ?  ?  ?  Passed - CBC within normal limits and completed in the last 12 months  ?  WBC  ?Date Value Ref Range Status  ?01/10/2022 7.2 3.8 - 10.8 Thousand/uL Final  ? ?RBC  ?Date Value Ref Range Status  ?01/10/2022 4.47 3.80 - 5.10 Million/uL Final  ? ?Hemoglobin  ?Date Value Ref Range Status  ?01/10/2022 13.0 11.7 - 15.5 g/dL Final  ? ?HCT  ?Date Value Ref Range Status  ?01/10/2022 38.4 35.0 - 45.0 % Final  ? ?  MCHC  ?Date Value Ref Range Status  ?01/10/2022 33.9 32.0 - 36.0 g/dL Final  ? ?MCH  ?Date Value Ref Range Status  ?01/10/2022 29.1 27.0 - 33.0 pg Final  ? ?MCV  ?Date Value Ref Range Status  ?01/10/2022 85.9 80.0 - 100.0 fL Final  ? ?No results found for: PLTCOUNTKUC, LABPLAT, [place] ?RDW  ?Date Value Ref Range Status  ?01/10/2022 13.1 11.0 - 15.0 % Final  ? ?  ?  ?  Passed - CMP within normal limits and completed in the last 12 months  ?  Albumin  ?Date Value Ref Range Status  ?10/30/2020 4.4 3.5 - 5.0 g/dL Final  ? ?Albumin ELP  ?Date Value Ref Range Status  ?08/01/2018 4.3 3.8 - 4.8 g/dL Final  ? ?Alkaline Phosphatase  ?Date Value Ref Range Status  ?10/30/2020 78 38 - 126 U/L Final  ? ?Alkaline phosphatase (APISO)  ?Date Value Ref Range Status  ?01/10/2022 64 36 - 128 U/L Final  ? ?ALT  ?Date Value Ref Range Status  ?01/10/2022 11 5 - 32 U/L Final  ? ?AST  ?Date Value Ref Range Status  ?01/10/2022 13 12 - 32 U/L Final  ? ?BUN  ?Date Value Ref Range Status  ?01/10/2022 10 7 - 20 mg/dL Final  ? ?Calcium  ?Date Value Ref Range Status  ?01/10/2022 9.0 8.9 - 10.4 mg/dL Final  ? ?CO2  ?Date Value Ref Range Status  ?01/10/2022 25 20 - 32 mmol/L Final  ? ?Creat  ?Date Value Ref Range Status  ?01/10/2022 0.62 0.50 - 0.96 mg/dL Final  ? ?Glucose, Bld  ?Date Value Ref Range Status  ?01/10/2022  87 65 - 99 mg/dL Final  ?  Comment:  ?  . ?           Fasting reference interval ?. ?  ? ?Potassium  ?Date Value Ref Range Status  ?01/10/2022 4.1 3.8 - 5.1 mmol/L Final  ? ?Sodium  ?Date Value Ref Range Status  ?01/10/2022 140 135 - 146 mmol/L Final  ? ?Total Bilirubin  ?Date Value Ref Range Status  ?01/10/2022 0.6 0.2 - 1.1 mg/dL Final  ? ?Protein, ur  ?Date Value Ref Range Status  ?10/30/2020 (A) NEGATIVE mg/dL Final  ? TEST NOT REPORTED DUE TO COLOR INTERFERENCE OF URINE PIGMENT  ? ?Total Protein  ?Date Value Ref Range Status  ?01/10/2022 5.8 (L) 6.3 - 8.2 g/dL Final  ? ?GFR calc Af Amer  ?Date Value Ref Range Status  ?01/10/2020 NOT CALCULATED >60 mL/min Final  ? ?GFR, Estimated  ?Date Value Ref Range Status  ?10/30/2020 >60 >60 mL/min Final  ?  Comment:  ?  (NOTE) ?Calculated using the CKD-EPI Creatinine Equation (2021) ?  ? ?  ?  ?  ? ? ? ? ?

## 2022-02-01 ENCOUNTER — Encounter: Payer: Self-pay | Admitting: Nurse Practitioner

## 2022-02-01 LAB — CALPROTECTIN: Calprotectin: 74 mcg/g

## 2022-02-02 ENCOUNTER — Other Ambulatory Visit: Payer: Self-pay | Admitting: Nurse Practitioner

## 2022-02-02 DIAGNOSIS — K921 Melena: Secondary | ICD-10-CM

## 2022-03-09 ENCOUNTER — Other Ambulatory Visit: Payer: Self-pay | Admitting: Nurse Practitioner

## 2022-03-09 ENCOUNTER — Encounter: Payer: Self-pay | Admitting: Nurse Practitioner

## 2022-03-09 DIAGNOSIS — F339 Major depressive disorder, recurrent, unspecified: Secondary | ICD-10-CM

## 2022-03-09 MED ORDER — ARIPIPRAZOLE 5 MG PO TABS: 5.0000 mg | ORAL_TABLET | Freq: Every day | ORAL | 1 refills | Status: DC

## 2022-03-13 ENCOUNTER — Ambulatory Visit: Payer: BC Managed Care – PPO | Admitting: Gastroenterology

## 2022-05-10 ENCOUNTER — Other Ambulatory Visit: Payer: Self-pay | Admitting: Nurse Practitioner

## 2022-05-10 DIAGNOSIS — G43009 Migraine without aura, not intractable, without status migrainosus: Secondary | ICD-10-CM

## 2022-05-10 NOTE — Telephone Encounter (Signed)
Requested medication (s) are due for refill today:   Yes  Requested medication (s) are on the active medication list:   Yes  Future visit scheduled:   No   Seen 3 mo. ago   Last ordered: 01/31/2022  #90, 0 refills   New rx for #90, 3 refills however pharmacy requesting an alternative.   See note.   Requested Prescriptions  Pending Prescriptions Disp Refills   TROKENDI XR 50 MG CP24 [Pharmacy Med Name: TROKENDI XR CAPS 30'S 50MG ] 90 capsule 3    Sig: TAKE 1 CAPSULE AT BEDTIME     Neurology: Anticonvulsants - topiramate & zonisamide Passed - 05/10/2022  1:25 AM      Passed - Cr in normal range and within 360 days    Creat  Date Value Ref Range Status  01/10/2022 0.62 0.50 - 0.96 mg/dL Final         Passed - CO2 in normal range and within 360 days    CO2  Date Value Ref Range Status  01/10/2022 25 20 - 32 mmol/L Final         Passed - ALT in normal range and within 360 days    ALT  Date Value Ref Range Status  01/10/2022 11 5 - 32 U/L Final         Passed - AST in normal range and within 360 days    AST  Date Value Ref Range Status  01/10/2022 13 12 - 32 U/L Final         Passed - Completed PHQ-2 or PHQ-9 in the last 360 days      Passed - Valid encounter within last 12 months    Recent Outpatient Visits           3 months ago Anxiety   [facility], FNP   4 months ago Blood in stool   [facility] M, DO   10 months ago Major depression, recurrent, chronic [facility])   [facility], FNP   11 months ago Nausea   [facility] M, DO   2 years ago Migraine without aura and without status migrainosus, not intractable   [facility], MD

## 2022-05-26 ENCOUNTER — Telehealth: Payer: BC Managed Care – PPO | Admitting: Nurse Practitioner

## 2022-05-26 DIAGNOSIS — J302 Other seasonal allergic rhinitis: Secondary | ICD-10-CM | POA: Diagnosis not present

## 2022-05-26 MED ORDER — AZELASTINE HCL 0.05 % OP SOLN: 1.0000 [drp] | Freq: Two times a day (BID) | OPHTHALMIC | 0 refills | Status: DC

## 2022-05-26 MED ORDER — LORATADINE 10 MG PO TABS: 10.0000 mg | ORAL_TABLET | Freq: Every day | ORAL | 11 refills | Status: DC

## 2022-05-26 NOTE — Progress Notes (Signed)
I have spent 5 minutes in review of e-visit questionnaire, review and updating patient chart, medical decision making and response to patient.  ° °Heru Montz W Maximiliano Cromartie, NP ° °  °

## 2022-05-26 NOTE — Progress Notes (Signed)
E visit for Allergic Rhinitis We are sorry that you are not feeling well.  Here is how we plan to help!  Based on what you have shared with me it looks like you have Allergic Rhinitis.  Rhinitis is when a reaction occurs that causes nasal congestion, runny nose, sneezing, and itching.  Most types of rhinitis are caused by an inflammation and are associated with symptoms in the eyes ears or throat. There are several types of rhinitis.  The most common are acute rhinitis, which is usually caused by a viral illness, allergic or seasonal rhinitis, and nonallergic or year-round rhinitis.  Nasal allergies occur certain times of the year.  Allergic rhinitis is caused when allergens in the air trigger the release of histamine in the body.  Histamine causes itching, swelling, and fluid to build up in the fragile linings of the nasal passages, sinuses and eyelids.  An itchy nose and clear discharge are common.  I recommend the following treatments: You should take a daily dose of antihistamine: claritin has been prescribed  You may also benefit from eye drops such as: Azelastine  HOME CARE:  You can use an over-the-counter saline nasal spray as needed Avoid areas where there is heavy dust, mites, or molds Stay indoors on windy days during the pollen season Keep windows closed in home, at least in bedroom; use air conditioner. Use high-efficiency house air filter Keep windows closed in car, turn AC on re-circulate Avoid playing out with dog during pollen season  GET HELP RIGHT AWAY IF:  If your symptoms do not improve within 10 days You become short of breath You develop yellow or green discharge from your nose for over 3 days You have coughing fits  MAKE SURE YOU:  Understand these instructions Will watch your condition Will get help right away if you are not doing well or get worse  Thank you for choosing an e-visit. Your e-visit answers were reviewed by a board certified advanced clinical  practitioner to complete your personal care plan. Depending upon the condition, your plan could have included both over the counter or prescription medications. Please review your pharmacy choice. Be sure that the pharmacy you have chosen is open so that you can pick up your prescription now.  If there is a problem you may message your provider in MyChart to have the prescription routed to another pharmacy. Your safety is important to [place]. If you have drug allergies check your prescription carefully.  For the next 24 hours, you can use MyChart to ask questions about today's visit, request a non-urgent call back, or ask for a work or school excuse from your e-visit provider. You will get an email in the next two days asking about your experience. I hope that your e-visit has been valuable and will speed your recovery.

## 2022-06-17 ENCOUNTER — Other Ambulatory Visit: Payer: Self-pay | Admitting: Nurse Practitioner

## 2022-06-17 DIAGNOSIS — J302 Other seasonal allergic rhinitis: Secondary | ICD-10-CM

## 2022-06-18 ENCOUNTER — Encounter: Payer: Self-pay | Admitting: Nurse Practitioner

## 2022-06-18 ENCOUNTER — Ambulatory Visit (INDEPENDENT_AMBULATORY_CARE_PROVIDER_SITE_OTHER): Payer: BC Managed Care – PPO | Admitting: Nurse Practitioner

## 2022-06-18 ENCOUNTER — Other Ambulatory Visit: Payer: Self-pay

## 2022-06-18 VITALS — BP 110/76 | HR 98 | Temp 98.3°F | Resp 18 | Ht 64.0 in | Wt 122.8 lb

## 2022-06-18 DIAGNOSIS — L988 Other specified disorders of the skin and subcutaneous tissue: Secondary | ICD-10-CM

## 2022-06-18 DIAGNOSIS — Z23 Encounter for immunization: Secondary | ICD-10-CM | POA: Diagnosis not present

## 2022-06-18 NOTE — Progress Notes (Signed)
BP 110/76   Pulse 98   Temp 98.3 F (36.8 C) (Oral)   Resp 18   Ht 5\' 4"  (1.626 m)   Wt 122 lb 12.8 oz (55.7 kg)   LMP 05/28/2022   SpO2 98%   BMI 21.08 kg/m    Subjective:    Patient ID: Tanya Higgins, female    DOB: [birth year], 21 y.o.   MRN: [id number]  HPI: Tanya Higgins is a 21 y.o. female  Chief Complaint  Patient presents with   Breast Problem    Purple dot on right breast for 1 week   Skin lesion/macule:  She says about a week ago she noticed a spot on her right breast.  She denies any pain, she says the spot has been getting lighter and lighter.  She denies any trauma or recent illness.  Upon inspection skin lesion/macule noted on  Right breast at 10 o'clock position. Breast exam normal.  Discussed that it looks benign and to observe for changes.       06/18/2022   12:57 PM 01/24/2022    2:02 PM 01/10/2022    3:57 PM 06/21/2021    2:18 PM 06/14/2021    1:28 PM  Depression screen PHQ 2/9  Decreased Interest 0 0 0 1 0  Down, Depressed, Hopeless 1 0 0 0 0  PHQ - 2 Score 1 0 0 1 0  Altered sleeping  2 0 0 0  Tired, decreased energy  3 0 3 1  Change in appetite  0 0 0 1  Feeling bad or failure about yourself   0 0 0 0  Trouble concentrating  1 0 0 0  Moving slowly or fidgety/restless  0 0 0 0  Suicidal thoughts  0 0 0 0  PHQ-9 Score  6 0 4 2  Difficult doing work/chores  Somewhat difficult Not difficult at all  Not difficult at all    Relevant past medical, surgical, family and social history reviewed and updated as indicated. Interim medical history since our last visit reviewed. Allergies and medications reviewed and updated.  Review of Systems  Constitutional: Negative for fever or weight change.  Respiratory: Negative for cough and shortness of breath.   Cardiovascular: Negative for chest pain or palpitations.  Gastrointestinal: Negative for abdominal pain, no bowel changes.  Musculoskeletal: Negative for gait problem or joint swelling.  Skin:  Negative for rash.  Positive for skin lesion on right breast Neurological: Negative for dizziness or headache.  No other specific complaints in a complete review of systems (except as listed in HPI above).      Objective:    BP 110/76   Pulse 98   Temp 98.3 F (36.8 C) (Oral)   Resp 18   Ht 5\' 4"  (1.626 m)   Wt 122 lb 12.8 oz (55.7 kg)   LMP 05/28/2022   SpO2 98%   BMI 21.08 kg/m   Wt Readings from Last 3 Encounters:  06/18/22 122 lb 12.8 oz (55.7 kg)  01/10/22 124 lb (56.2 kg) (42 %, Z= -0.19)*  06/21/21 108 lb (49 kg) (13 %, Z= -1.12)*   * Growth percentiles are based on CDC (Girls, 2-20 Years) data.    Physical Exam  Constitutional: Patient appears well-developed and well-nourished.  No distress.  HEENT: head atraumatic, normocephalic, pupils equal and reactive to light,neck supple Cardiovascular: Normal rate, regular rhythm and normal heart sounds.  No murmur heard. No BLE edema. Breast: no lumps or masses noted Pulmonary/Chest:  Effort normal and breath sounds normal. No respiratory distress. Abdominal: Soft.  There is no tenderness. Skin: small macule noted to right breast at 10 o' clock position Psychiatric: Patient has a normal mood and affect. behavior is normal. Judgment and thought content normal.      Assessment & Plan:   Problem List Items Addressed This Visit   None Visit Diagnoses     Skin macule    -  Primary   patient reports that it has gotten lighter, discussed monitoring for changes. Likely benign macule.    Need for influenza vaccination       Relevant Orders   Flu Vaccine QUAD 6+ mos PF IM (Fluarix Quad PF) (Completed)        Follow up plan: Return if symptoms worsen or fail to improve.

## 2022-06-18 NOTE — Progress Notes (Signed)
b re

## 2022-06-18 NOTE — Telephone Encounter (Signed)
Requested medication (s) are due for refill today: yes  Requested medication (s) are on the active medication list: yes  Last refill:  05/26/22  Future visit scheduled: no seen today   Notes to clinic:  no refills. Do you want to continue refills?     Requested Prescriptions  Pending Prescriptions Disp Refills   azelastine (OPTIVAR) 0.05 % ophthalmic solution [Pharmacy Med Name: AZELASTINE HCL 0.05% DROPS] 6 mL 0    Sig: INSTILL 1 DROP INTO BOTH EYES TWICE A DAY     Ophthalmology:  Antiallergy Passed - 06/17/2022 11:30 AM      Passed - Valid encounter within last 12 months    Recent Outpatient Visits           Today Skin macule   [place], FNP   4 months ago [place], FNP   5 months ago Blood in stool   [place], DO   12 months ago Major depression, recurrent, chronic [facility])   [facility] Bo Merino, FNP   1 year ago Nausea   [facility] Myles Gip, [place]

## 2022-06-24 ENCOUNTER — Encounter: Payer: Self-pay | Admitting: Nurse Practitioner

## 2022-07-27 ENCOUNTER — Other Ambulatory Visit: Payer: Self-pay | Admitting: Nurse Practitioner

## 2022-07-27 DIAGNOSIS — F339 Major depressive disorder, recurrent, unspecified: Secondary | ICD-10-CM

## 2022-07-30 ENCOUNTER — Other Ambulatory Visit: Payer: Self-pay | Admitting: Nurse Practitioner

## 2022-07-30 NOTE — Telephone Encounter (Signed)
Requested Prescriptions  Pending Prescriptions Disp Refills  . busPIRone (BUSPAR) 15 MG tablet [Pharmacy Med Name: BUSPIRONE HCL TABS 15MG ] 180 tablet 0    Sig: TAKE 1 TABLET TWICE A DAY     Psychiatry: Anxiolytics/Hypnotics - Non-controlled Passed - 07/27/2022 10:06 PM      Passed - Valid encounter within last 12 months    Recent Outpatient Visits          1 month ago Skin macule   [place], FNP   6 months ago [place], FNP   6 months ago Blood in stool   [place], DO   1 year ago Major depression, recurrent, chronic [facility])   [facility] Bo Merino, FNP   1 year ago Nausea   [facility] Myles Gip, [place]

## 2022-07-31 NOTE — Telephone Encounter (Signed)
Requested medication (s) are due for refill today: Due 08/03/22  Requested medication (s) are on the active medication list: yes    Last refill: 01/31/22  #90  1 refill  Future visit scheduled no  Notes to clinic:  Failed due to labs, please review.Thank you  Requested Prescriptions  Pending Prescriptions Disp Refills   DULoxetine (CYMBALTA) 60 MG capsule [Pharmacy Med Name: DULOXETINE HCL DR CAPS 60MG] 90 capsule 3    Sig: TAKE 1 CAPSULE DAILY     Psychiatry: Antidepressants - SNRI - duloxetine Failed - 07/30/2022 12:17 AM      Failed - eGFR is 30 or above and within 360 days    GFR calc Af Amer  Date Value Ref Range Status  01/10/2020 NOT CALCULATED >60 mL/min Final   GFR, Estimated  Date Value Ref Range Status  10/30/2020 >60 >60 mL/min Final    Comment:    (NOTE) Calculated using the CKD-EPI Creatinine Equation (2021)          Passed - Cr in normal range and within 360 days    Creat  Date Value Ref Range Status  01/10/2022 0.62 0.50 - 0.96 mg/dL Final         Passed - Completed PHQ-2 or PHQ-9 in the last 360 days      Passed - Last BP in normal range    BP Readings from Last 1 Encounters:  06/18/22 110/76         Passed - Valid encounter within last 6 months    Recent Outpatient Visits           1 month ago Skin macule   [place], FNP   6 months ago [place], FNP   6 months ago Blood in stool   [place], DO   1 year ago Major depression, recurrent, chronic [facility])   [facility] Bo Merino, FNP   1 year ago Nausea   [place], [place], [place]

## 2022-08-19 ENCOUNTER — Encounter: Payer: Self-pay | Admitting: Nurse Practitioner

## 2022-08-27 ENCOUNTER — Ambulatory Visit: Payer: BC Managed Care – PPO | Admitting: Family Medicine

## 2022-10-08 ENCOUNTER — Encounter: Payer: Self-pay | Admitting: Nurse Practitioner

## 2022-10-08 ENCOUNTER — Other Ambulatory Visit: Payer: Self-pay

## 2022-10-08 ENCOUNTER — Telehealth (INDEPENDENT_AMBULATORY_CARE_PROVIDER_SITE_OTHER): Payer: BC Managed Care – PPO | Admitting: Nurse Practitioner

## 2022-10-08 DIAGNOSIS — F332 Major depressive disorder, recurrent severe without psychotic features: Secondary | ICD-10-CM | POA: Insufficient documentation

## 2022-10-08 DIAGNOSIS — F419 Anxiety disorder, unspecified: Secondary | ICD-10-CM | POA: Diagnosis not present

## 2022-10-08 MED ORDER — CARIPRAZINE HCL 1.5 MG PO CAPS: 1.5000 mg | ORAL_CAPSULE | Freq: Every day | ORAL | 0 refills | Status: DC

## 2022-10-08 NOTE — Assessment & Plan Note (Signed)
Continue taking buspar 15 mg two times a day, hydroxyzine 25 mg at night, cymbalta 60 mg daily and abilify 5 mg daily.  If we get the vryalar approved stop the abilify and take the vraylar.  Recommend seeing psychiatry referral placed also gave number to [facility] who has walk in times.  

## 2022-10-08 NOTE — Progress Notes (Signed)
Name: Tanya Higgins   MRN: [id number]    DOB: [birth year]   Date:10/08/2022       Progress Note  Subjective  Chief Complaint  Chief Complaint  Patient presents with   Medication Problem   Anxiety   Depression    I connected with  Tanya Higgins  on 10/08/22 at 1:35 pm by a video enabled telemedicine application and verified that I am speaking with the correct person using two identifiers.  I discussed the limitations of evaluation and management by telemedicine and the availability of in person appointments. The patient expressed understanding and agreed to proceed with a virtual visit  Staff also discussed with the patient that there may be a patient responsible charge related to this service. Patient Location: home Provider Location: [facility] Additional Individuals present: alone  HPI  Depression/anxiety: she is currently on hydroxyzine 25 mg at bed time, cymbalta 60 mg daily, buspar 15 mg two times daily, and Abilify 5 mg daily.patient reports her anxiety and depression has gotten worse. She says she does have times where she forgets to take her medications.  She says she knows that might be part of the problem but is concerned that her meds are just not working anymore.   Her PHQ9 and GAD scores are positive. She does have suicidal thoughts but no plan.  Discussed that with her worsening anxiety and depression it would best for her to see psychiatry.  Discussed that we could try changing abilify to vraylar and see if that helps. She is in agreement to see if that helps.  Also discussed case with Dr. Carlynn Purl.     10/08/2022    1:30 PM 06/18/2022   12:57 PM 01/24/2022    2:02 PM 01/10/2022    3:57 PM 06/21/2021    2:18 PM  Depression screen PHQ 2/9  Decreased Interest 2 0 0 0 1  Down, Depressed, Hopeless 3 1 0 0 0  PHQ - 2 Score 5 1 0 0 1  Altered sleeping 1  2 0 0  Tired, decreased energy 2  3 0 3  Change in appetite 3  0 0 0  Feeling bad or failure about yourself  3  0 0 0  Trouble  concentrating 2  1 0 0  Moving slowly or fidgety/restless 3  0 0 0  Suicidal thoughts 2  0 0 0  PHQ-9 Score 21  6 0 4  Difficult doing work/chores Very difficult  Somewhat difficult Not difficult at all        10/08/2022    1:31 PM 01/24/2022    2:03 PM 06/21/2021    2:45 PM 06/14/2021    1:29 PM  GAD 7 : Generalized Anxiety Score  Nervous, Anxious, on Edge 3 1 0 0  Control/stop worrying 0 0 0 0  Worry too much - different things 2 0 0 0  Trouble relaxing 1 1 0 0  Restless 1 3 0 0  Easily annoyed or irritable 3 0 0 0  Afraid - awful might happen 2 1 0 0  Total GAD 7 Score 12 6 0 0  Anxiety Difficulty Somewhat difficult Not difficult at all  Not difficult at all     Patient Active Problem List   Diagnosis Date Noted   Severe episode of recurrent major depressive disorder, without psychotic features (HCC) 10/08/2022   Blood in stool 01/10/2022   Hallucinations 06/19/2019   Loss of weight 06/19/2019   Panic 02/10/2018  Tension headache 08/16/2017   Migraine without aura and without status migrainosus, not intractable 08/14/2017   Major depression, recurrent, chronic (HCC) 08/14/2017   Allergic rhinitis, seasonal 06/17/2015   Anxiety 05/07/2015   Picky eater 05/07/2015   Isolated phobia 05/07/2015    Social History   Tobacco Use   Smoking status: Never   Smokeless tobacco: Never  Substance Use Topics   Alcohol use: No    Alcohol/week: 0.0 standard drinks of alcohol     Current Outpatient Medications:    azelastine (OPTIVAR) 0.05 % ophthalmic solution, Place 1 drop into both eyes 2 (two) times daily., Disp: 6 mL, Rfl: 0   busPIRone (BUSPAR) 15 MG tablet, TAKE 1 TABLET TWICE A DAY, Disp: 180 tablet, Rfl: 0   cariprazine (VRAYLAR) 1.5 MG capsule, Take 1 capsule (1.5 mg total) by mouth daily., Disp: 30 capsule, Rfl: 0   DULoxetine (CYMBALTA) 60 MG capsule, TAKE 1 CAPSULE DAILY, Disp: 90 capsule, Rfl: 3   hydrOXYzine (ATARAX) 25 MG tablet, Take 1 tablet (25 mg total) by  mouth every 8 (eight) hours as needed., Disp: 90 tablet, Rfl: 3   loratadine (CLARITIN) 10 MG tablet, Take 1 tablet (10 mg total) by mouth daily., Disp: 30 tablet, Rfl: 11   TROKENDI XR 50 MG CP24, TAKE 1 CAPSULE AT BEDTIME, Disp: 90 capsule, Rfl: 3  No Known Allergies  I personally reviewed active problem list, medication list, allergies, notes from last encounter with the patient/caregiver today.  ROS  Constitutional: Negative for fever or weight change.  Respiratory: Negative for cough and shortness of breath.   Cardiovascular: Negative for chest pain or palpitations.  Gastrointestinal: Negative for abdominal pain, no bowel changes.  Musculoskeletal: Negative for gait problem or joint swelling.  Skin: Negative for rash.  Neurological: Negative for dizziness or headache.  No other specific complaints in a complete review of systems (except as listed in HPI above).   Objective  Virtual encounter, vitals not obtained.  There is no height or weight on file to calculate BMI.  Nursing Note and Vital Signs reviewed.  Physical Exam  Awake, alert and oriented, speaking in complete sentences  No results found for this or any previous visit (from the past 72 hour(s)).  Assessment & Plan  Problem List Items Addressed This Visit       Other   Anxiety - Primary   Relevant Medications   cariprazine (VRAYLAR) 1.5 MG capsule   Other Relevant Orders   Ambulatory referral to Psychiatry   Severe episode of recurrent major depressive disorder, without psychotic features (HCC)    Continue taking buspar 15 mg two times a day, hydroxyzine 25 mg at night, cymbalta 60 mg daily and abilify 5 mg daily.  If we get the vryalar approved stop the abilify and take the vraylar.  Recommend seeing psychiatry referral placed also gave number to [facility] who has walk in times.       Relevant Medications   cariprazine (VRAYLAR) 1.5 MG capsule   Other Relevant Orders   Ambulatory referral to Psychiatry      -Red flags and when to present for emergency care or RTC including fever >101.72F, chest pain, shortness of breath, new/worsening/un-resolving symptoms,  reviewed with patient at time of visit. Follow up and care instructions discussed and provided in AVS. - I discussed the assessment and treatment plan with the patient. The patient was provided an opportunity to ask questions and all were answered. The patient agreed with the plan and demonstrated an  understanding of the instructions.  I provided 15 minutes of non-face-to-face time during this encounter.  Bo Merino, FNP

## 2022-10-08 NOTE — Assessment & Plan Note (Signed)
Continue taking buspar 15 mg two times a day, hydroxyzine 25 mg at night, cymbalta 60 mg daily and abilify 5 mg daily.  If we get the vryalar approved stop the abilify and take the vraylar.  Recommend seeing psychiatry referral placed also gave number to [facility] who has walk in times.

## 2022-10-15 NOTE — Telephone Encounter (Unsigned)
Copied from [place] [id number]. Topic: General - Other >> Oct 15, 2022  3:26 PM Everette C wrote: Reason for CRM: Juanda Crumble with [facility] has called regarding a prior authorization for the patient's cariprazine (VRAYLAR) 1.5 MG capsule [[id number]] prescription has been approved through 08/12/22 through 10/12/23  Ref [id number]  Please contact further if needed

## 2022-11-10 ENCOUNTER — Encounter: Payer: Self-pay | Admitting: Nurse Practitioner

## 2022-11-12 ENCOUNTER — Other Ambulatory Visit: Payer: Self-pay | Admitting: Nurse Practitioner

## 2022-11-12 DIAGNOSIS — Z01419 Encounter for gynecological examination (general) (routine) without abnormal findings: Secondary | ICD-10-CM

## 2022-11-15 DIAGNOSIS — F331 Major depressive disorder, recurrent, moderate: Secondary | ICD-10-CM | POA: Diagnosis not present

## 2022-11-16 ENCOUNTER — Other Ambulatory Visit: Payer: Self-pay | Admitting: Nurse Practitioner

## 2022-11-16 DIAGNOSIS — F332 Major depressive disorder, recurrent severe without psychotic features: Secondary | ICD-10-CM

## 2022-11-16 DIAGNOSIS — F419 Anxiety disorder, unspecified: Secondary | ICD-10-CM

## 2022-11-18 ENCOUNTER — Other Ambulatory Visit: Payer: Self-pay | Admitting: Nurse Practitioner

## 2022-11-18 DIAGNOSIS — F332 Major depressive disorder, recurrent severe without psychotic features: Secondary | ICD-10-CM

## 2022-11-18 DIAGNOSIS — F419 Anxiety disorder, unspecified: Secondary | ICD-10-CM

## 2022-11-19 MED ORDER — CARIPRAZINE HCL 1.5 MG PO CAPS: 1.5000 mg | ORAL_CAPSULE | Freq: Every day | ORAL | 0 refills | Status: DC

## 2022-11-19 NOTE — Telephone Encounter (Signed)
Unable to refill per protocol, Rx request was refilled 11/19/22.  Requested Prescriptions  Pending Prescriptions Disp Refills   VRAYLAR 1.5 MG capsule [Pharmacy Med Name: VRAYLAR 1.5 MG CAPSULE] 30 capsule 0    Sig: TAKE 1 CAPSULE BY MOUTH DAILY.     Off-Protocol Failed - 11/18/2022 10:52 AM      Failed - Medication not assigned to a protocol, review manually.      Passed - Valid encounter within last 12 months    Recent Outpatient Visits           1 month ago [place], FNP   5 months ago Skin macule   [facility] Bo Merino, FNP   9 months ago [facility] Bo Merino, FNP   10 months ago Blood in stool   [place], DO   1 year ago Major depression, recurrent, chronic [facility])   [facility] Bo Merino, [place]

## 2022-11-22 ENCOUNTER — Ambulatory Visit (INDEPENDENT_AMBULATORY_CARE_PROVIDER_SITE_OTHER): Payer: BC Managed Care – PPO | Admitting: Nurse Practitioner

## 2022-11-22 ENCOUNTER — Telehealth: Payer: BC Managed Care – PPO | Admitting: Physician Assistant

## 2022-11-22 ENCOUNTER — Encounter: Payer: Self-pay | Admitting: Nurse Practitioner

## 2022-11-22 ENCOUNTER — Other Ambulatory Visit: Payer: Self-pay

## 2022-11-22 VITALS — BP 120/74 | HR 111 | Temp 97.9°F | Resp 18 | Ht 64.0 in | Wt 121.4 lb

## 2022-11-22 DIAGNOSIS — R0602 Shortness of breath: Secondary | ICD-10-CM

## 2022-11-22 DIAGNOSIS — U071 COVID-19: Secondary | ICD-10-CM | POA: Diagnosis not present

## 2022-11-22 MED ORDER — BENZONATATE 100 MG PO CAPS: 200.0000 mg | ORAL_CAPSULE | Freq: Two times a day (BID) | ORAL | 0 refills | Status: DC | PRN

## 2022-11-22 MED ORDER — MOLNUPIRAVIR EUA 200MG CAPSULE: 4.0000 | ORAL_CAPSULE | Freq: Two times a day (BID) | ORAL | 0 refills | Status: AC

## 2022-11-22 NOTE — Progress Notes (Signed)
BP 120/74   Pulse (!) 111   Temp 97.9 F (36.6 C) (Oral)   Resp 18   Ht 5' 4"$  (1.626 m)   Wt 121 lb 6.4 oz (55.1 kg)   SpO2 99%   BMI 20.84 kg/m    Subjective:    Patient ID: Tanya Higgins, female    DOB: Feb 20, 2001, 22 y.o.   MRN: [id number]  HPI: Tanya Higgins is a 22 y.o. female  Chief Complaint  Patient presents with   Covid Positive   Shortness of Breath   Covid: patient reports she tested positive for covid 2 days ago. Symptoms started 2 days ago. Symptoms include cough, shortness of breath, sore throat,  headache, brain fog, fever, nasal congestion, body aches.  She is currently taking dayquil and nyquil for symptoms.  She says that it is not helping.  She says that she has also been taking elderberry. Patient pulse ox and lung sound were clear, reassurance provided to patient.   Recommend taking zyrtec, flonase, mucinex, vitamin d, vitamin c, and zinc. Push fluids and get rest.     Will send in molnupirnavir and tessalon perls.   Relevant past medical, surgical, family and social history reviewed and updated as indicated. Interim medical history since our last visit reviewed. Allergies and medications reviewed and updated.  Review of Systems  Constitutional: positive for fever or weight change.  HEENT: positive for nasal congestion, sore throat Respiratory: positive for cough and shortness of breath.   Cardiovascular: Negative for chest pain or palpitations.  Gastrointestinal: Negative for abdominal pain, no bowel changes.  Musculoskeletal: Negative for gait problem or joint swelling.  Skin: Negative for rash.  Neurological: Negative for dizziness , positive for  headache.  No other specific complaints in a complete review of systems (except as listed in HPI above).      Objective:    BP 120/74   Pulse (!) 111   Temp 97.9 F (36.6 C) (Oral)   Resp 18   Ht 5' 4"$  (1.626 m)   Wt 121 lb 6.4 oz (55.1 kg)   SpO2 99%   BMI 20.84 kg/m   Wt Readings from  Last 3 Encounters:  11/22/22 121 lb 6.4 oz (55.1 kg)  06/18/22 122 lb 12.8 oz (55.7 kg)  01/10/22 124 lb (56.2 kg) (42 %, Z= -0.19)*   * Growth percentiles are based on CDC (Girls, 2-20 Years) data.    Physical Exam  Constitutional: Patient appears well-developed and well-nourished.  No distress.  HEENT: head atraumatic, normocephalic, pupils equal and reactive to light, ears TMs clear, neck supple, throat within normal limits Cardiovascular: Normal rate, regular rhythm and normal heart sounds.  No murmur heard. No BLE edema. Pulmonary/Chest: Effort normal and breath sounds normal. No respiratory distress. Abdominal: Soft.  There is no tenderness. Psychiatric: Patient has a normal mood and affect. behavior is normal. Judgment and thought content normal.  Results for orders placed or performed in visit on 01/25/22  CALPROTECTIN  Result Value Ref Range   Calprotectin 74 mcg/g      Assessment & Plan:   Problem List Items Addressed This Visit   None Visit Diagnoses     COVID-19    -  Primary   start molnupiravir, tessalon perls.zyrtec, flonase, mucinex, vitamin d, vitamin c, and zinc. Push fluids and get rest.   Relevant Medications   molnupiravir EUA (LAGEVRIO) 200 mg CAPS capsule   benzonatate (TESSALON) 100 MG capsule  Follow up plan: Return if symptoms worsen or fail to improve.

## 2022-11-22 NOTE — Progress Notes (Signed)
Because of severe shortness of breath and more significant COVID symptoms, warranting need for examination and check of oxygen levels, I feel your condition warrants further evaluation and I recommend that you be seen in a face to face visit.   NOTE: There will be NO CHARGE for this eVisit   If you are having a true medical emergency please call 911.      For an urgent face to face visit, [place] has eight urgent care centers for your convenience:   NEW!! [place] [facility] [place] Get Driving Directions [id number] [address] [place], [place] [facility] [place] [facility] [place] Get Driving Directions [id number] [place], [place] [address]   [place] [facility] [place] [facility]) Get Driving Directions [id number] [address], [place] [address]  [place] [facility] [place] [facility] [place]) Get Driving Directions [id number] [address] [place],  [place]  [address]  [place] [facility] [place] [facility] at [address] [place] Get Driving Directions  [id number] [place],  [place] [address]   [place] [facility] Get Driving Directions [id number] [address] [address] [place], [place], [place] [address]   [place] [facility] Get Driving Directions  [id number] [address] [place],  [address]    Urgent Care at [place] Get Driving Directions [id number] [address] [place],  [address]  Your MyChart E-visit questionnaire answers were reviewed by a board certified advanced clinical practitioner to complete your personal care plan based on your specific symptoms.  Thank you for using e-Visits.

## 2022-12-12 ENCOUNTER — Encounter: Payer: Self-pay | Admitting: Obstetrics & Gynecology

## 2022-12-12 ENCOUNTER — Ambulatory Visit (INDEPENDENT_AMBULATORY_CARE_PROVIDER_SITE_OTHER): Payer: BC Managed Care – PPO | Admitting: Obstetrics & Gynecology

## 2022-12-12 VITALS — BP 90/60 | Ht 64.0 in | Wt 120.0 lb

## 2022-12-12 DIAGNOSIS — Z01419 Encounter for gynecological examination (general) (routine) without abnormal findings: Secondary | ICD-10-CM

## 2022-12-12 NOTE — Progress Notes (Signed)
GYNECOLOGY ANNUAL PHYSICAL EXAM PROGRESS NOTE  Subjective:    Tanya Higgins is a 22 y.o. yo single G0 who presents for an annual exam. The patient has no complaints today. The patient has never been sexually active. The patient participates in regular exercise: no. Has the patient ever been transfused or tattooed?: yes. The patient reports that there is not domestic violence in her life.    Menstrual History: Menarche age: 22 Patient's last menstrual period was 12/07/2022. Period Cycle (Days): 28 Period Duration (Days): 5-6 Period Pattern: Regular Menstrual Flow: Heavy, Light, Moderate Dysmenorrhea: (!) Severe Dysmenorrhea Symptoms: Cramping      Upstream - 12/12/22 0919       Pregnancy Intention Screening   Does the patient want to become pregnant in the next year? No    Does the patient's partner want to become pregnant in the next year? No    Would the patient like to discuss contraceptive options today? No      Contraception Wrap Up   Current Method No Method - Other Reason    End Method No Method - Other Reason    Contraception Counseling Provided No               OB History  No obstetric history on file.    Past Medical History:  Diagnosis Date   Anxiety    HA (headache)    Kidney stone    Simple phobia    UTI (urinary tract infection)     History reviewed. No pertinent surgical history.  Family History  Problem Relation Age of Onset   Depression Mother    Hypertension Mother    Asthma Mother    Migraines Mother    Alcohol abuse Father     Social History   Socioeconomic History   Marital status: Single    Spouse name: Not on file   Number of children: Not on file   Years of education: Not on file   Highest education level: 11th grade  Occupational History   Occupation: student   Tobacco Use   Smoking status: Never   Smokeless tobacco: Never  Vaping Use   Vaping Use: Never used  Substance and Sexual Activity   Alcohol use:  No    Alcohol/week: 0.0 standard drinks of alcohol   Drug use: No   Sexual activity: Not Currently  Other Topics Concern   Not on file  Social History Narrative   Kalany is a 12 th grade student.   She attends [employer], but virtually since 10/2018    She lives with both parents. She has one sister.   Social Determinants of Health   Financial Resource Strain: Low Risk  (08/01/2018)   Overall Financial Resource Strain (CARDIA)    Difficulty of Paying Living Expenses: Not hard at all  Food Insecurity: No Food Insecurity (08/01/2018)   Hunger Vital Sign    Worried About Running Out of Food in the Last Year: Never true    Ran Out of Food in the Last Year: Never true  Transportation Needs: No Transportation Needs (08/01/2018)   PRAPARE - Hydrologist (Medical): No    Lack of Transportation (Non-Medical): No  Physical Activity: Inactive (08/01/2018)   Exercise Vital Sign    Days of Exercise per Week: 0 days    Minutes of Exercise per Session: 0 min  Stress: Stress Concern Present (08/01/2018)   [place]  Stress Questionnaire    Feeling of Stress : Very much  Social Connections: Moderately Isolated (08/01/2018)   Social Connection and Isolation Panel [NHANES]    Frequency of Communication with Friends and Family: More than three times a week    Frequency of Social Gatherings with Friends and Family: More than three times a week    Attends Religious Services: Never    Marine scientist or Organizations: No    Attends Archivist Meetings: Never    Marital Status: Never married  Intimate Partner Violence: Not At Risk (08/01/2018)   Humiliation, Afraid, Rape, and Kick questionnaire    Fear of Current or Ex-Partner: No    Emotionally Abused: No    Physically Abused: No    Sexually Abused: No    Current Outpatient Medications on File Prior to Visit  Medication Sig Dispense Refill    busPIRone (BUSPAR) 15 MG tablet TAKE 1 TABLET TWICE A DAY 180 tablet 0   cariprazine (VRAYLAR) 1.5 MG capsule Take 1 capsule (1.5 mg total) by mouth daily. 30 capsule 0   DULoxetine (CYMBALTA) 60 MG capsule TAKE 1 CAPSULE DAILY 90 capsule 3   hydrOXYzine (ATARAX) 25 MG tablet Take 1 tablet (25 mg total) by mouth every 8 (eight) hours as needed. 90 tablet 3   loratadine (CLARITIN) 10 MG tablet Take 1 tablet (10 mg total) by mouth daily. 30 tablet 11   TROKENDI XR 50 MG CP24 TAKE 1 CAPSULE AT BEDTIME 90 capsule 3   benzonatate (TESSALON) 100 MG capsule Take 2 capsules (200 mg total) by mouth 2 (two) times daily as needed for cough. (Patient not taking: Reported on 12/12/2022) 20 capsule 0   No current facility-administered medications on file prior to visit.    No Known Allergies   Review of Systems Constitutional: negative for chills, fatigue, fevers and sweats Eyes: negative for irritation, redness and visual disturbance Ears, nose, mouth, throat, and face: negative for hearing loss, nasal congestion, snoring and tinnitus Respiratory: negative for asthma, cough, sputum Cardiovascular: negative for chest pain, dyspnea, exertional chest pressure/discomfort, irregular heart beat, palpitations and syncope Gastrointestinal: negative for abdominal pain, change in bowel habits, nausea and vomiting Genitourinary: negative for abnormal menstrual periods, genital lesions, sexual problems and vaginal discharge, dysuria and urinary incontinence Integument/breast: negative for breast lump, breast tenderness and nipple discharge Hematologic/lymphatic: negative for bleeding and easy bruising Musculoskeletal:negative for back pain and muscle weakness Neurological: negative for dizziness, headaches, vertigo and weakness Endocrine: negative for diabetic symptoms including polydipsia, polyuria and skin dryness Allergic/Immunologic: negative for hay fever and urticaria      Objective:  Blood pressure  90/60, height '5\' 4"'$  (1.626 m), weight 120 lb (54.4 kg), last menstrual period 12/07/2022. Body mass index is 20.6 kg/m.    General Appearance:    Alert, cooperative, no distress, appears stated age  Head:    Normocephalic, without obvious abnormality, atraumatic  Eyes:    PERRL, conjunctiva/corneas clear, EOM's intact, both eyes  Ears:    Normal external ear canals, both ears  Nose:   Nares normal, septum midline, mucosa normal, no drainage or sinus tenderness  Throat:   Lips, mucosa, and tongue normal; teeth and gums normal  Neck:   Supple, symmetrical, trachea midline, no adenopathy; thyroid: no enlargement/tenderness/nodules; no carotid bruit or JVD  Back:     Symmetric, no curvature, ROM normal, no CVA tenderness  Lungs:     Clear to auscultation bilaterally, respirations unlabored  Chest Wall:  No tenderness or deformity   Heart:    Regular rate and rhythm, S1 and S2 normal, no murmur, rub or gallop  Breast Exam:    No tenderness, masses, or nipple abnormality  Abdomen:     Soft, non-tender, bowel sounds active all four quadrants, no masses, no organomegaly.    Genitalia:    Pelvic:external genitalia normal     Extremities:   Extremities normal, atraumatic, no cyanosis or edema  Pulses:   2+ and symmetric all extremities  Skin:   Skin color, texture, turgor normal, no rashes or lesions  Lymph nodes:   Cervical, supraclavicular, and axillary nodes normal  Neurologic:   CNII-XII intact, normal strength, sensation and reflexes throughout   .  Labs:  Lab Results  Component Value Date   WBC 7.2 01/10/2022   HGB 13.0 01/10/2022   HCT 38.4 01/10/2022   MCV 85.9 01/10/2022   PLT 238 01/10/2022    Lab Results  Component Value Date   CREATININE 0.62 01/10/2022   BUN 10 01/10/2022   NA 140 01/10/2022   K 4.1 01/10/2022   CL 108 01/10/2022   CO2 25 01/10/2022    Lab Results  Component Value Date   ALT 11 01/10/2022   AST 13 01/10/2022   ALKPHOS 78 10/30/2020   BILITOT  0.6 01/10/2022    Lab Results  Component Value Date   TSH 1.58 08/01/2018     Assessment:   1. Encounter for annual routine gynecological examination      Plan:  I explained that a pap is not indicated until 21 years of age. I rec'd that she come back for annual with pap in a year. Breast self exam technique reviewed and patient encouraged to perform self-exam monthly.  Discussed healthy lifestyle modifications.  Follow up in 1 year for annual exam   Emily Filbert, MD [place]

## 2022-12-23 ENCOUNTER — Other Ambulatory Visit: Payer: Self-pay | Admitting: Nurse Practitioner

## 2022-12-23 DIAGNOSIS — F419 Anxiety disorder, unspecified: Secondary | ICD-10-CM

## 2022-12-23 DIAGNOSIS — F332 Major depressive disorder, recurrent severe without psychotic features: Secondary | ICD-10-CM

## 2022-12-24 MED ORDER — CARIPRAZINE HCL 1.5 MG PO CAPS: 1.5000 mg | ORAL_CAPSULE | Freq: Every day | ORAL | 0 refills | Status: DC

## 2022-12-28 ENCOUNTER — Telehealth: Payer: BC Managed Care – PPO | Admitting: Physician Assistant

## 2022-12-28 DIAGNOSIS — F419 Anxiety disorder, unspecified: Secondary | ICD-10-CM

## 2022-12-28 DIAGNOSIS — K2900 Acute gastritis without bleeding: Secondary | ICD-10-CM | POA: Diagnosis not present

## 2022-12-28 MED ORDER — SUCRALFATE 1 G PO TABS: 1.0000 g | ORAL_TABLET | Freq: Three times a day (TID) | ORAL | 0 refills | Status: DC

## 2022-12-28 NOTE — Patient Instructions (Signed)
Tanya Higgins, thank you for joining Mar Daring, PA-C for today's virtual visit.  While this provider is not your primary care provider (PCP), if your PCP is located in our provider database this encounter information will be shared with them immediately following your visit.   [facility] account gives you access to today's visit and all your visits, tests, and labs performed at [facility] " click here if you don't have a [facility] account or go to [email or web address]  Consent: (Patient) Tanya Higgins provided verbal consent for this virtual visit at the beginning of the encounter.  Current Medications:  Current Outpatient Medications:    sucralfate (CARAFATE) 1 g tablet, Take 1 tablet (1 g total) by mouth 4 (four) times daily -  with meals and at bedtime., Disp: 40 tablet, Rfl: 0   benzonatate (TESSALON) 100 MG capsule, Take 2 capsules (200 mg total) by mouth 2 (two) times daily as needed for cough. (Patient not taking: Reported on 12/12/2022), Disp: 20 capsule, Rfl: 0   busPIRone (BUSPAR) 15 MG tablet, TAKE 1 TABLET TWICE A DAY, Disp: 180 tablet, Rfl: 0   cariprazine (VRAYLAR) 1.5 MG capsule, Take 1 capsule (1.5 mg total) by mouth daily., Disp: 30 capsule, Rfl: 0   DULoxetine (CYMBALTA) 60 MG capsule, TAKE 1 CAPSULE DAILY, Disp: 90 capsule, Rfl: 3   hydrOXYzine (ATARAX) 25 MG tablet, Take 1 tablet (25 mg total) by mouth every 8 (eight) hours as needed., Disp: 90 tablet, Rfl: 3   loratadine (CLARITIN) 10 MG tablet, Take 1 tablet (10 mg total) by mouth daily., Disp: 30 tablet, Rfl: 11   TROKENDI XR 50 MG CP24, TAKE 1 CAPSULE AT BEDTIME, Disp: 90 capsule, Rfl: 3   Medications ordered in this encounter:  Meds ordered this encounter  Medications   sucralfate (CARAFATE) 1 g tablet    Sig: Take 1 tablet (1 g total) by mouth 4 (four) times daily -  with meals and at bedtime.    Dispense:  40 tablet    Refill:  0    Order Specific Question:    Supervising Provider    Answer:   Chase Picket [id number]     *If you need refills on other medications prior to your next appointment, please contact your pharmacy*  Follow-Up: Call back or seek an in-person evaluation if the symptoms worsen or if the condition fails to improve as anticipated.  [place] [phone]  Other Instructions  Gastritis, Adult Gastritis is irritation and swelling (inflammation) of the stomach. There are two kinds of gastritis: Acute gastritis. This kind develops quickly. Chronic gastritis. This kind is much more common. It develops slowly and lasts for a long time. It is important to get help for this condition. If you do not get help, your stomach can bleed, and you can get sores (ulcers) in your stomach. What are the causes? This condition may be caused by: Germs that get to your stomach and cause an infection. Drinking too much alcohol. Medicines you are taking. Having too much acid in the stomach. Having a disease of the stomach. Other causes may include: An allergic reaction. Some cancer treatments (radiation). Smoking cigarettes or using products that contain nicotine or tobacco. In some cases, the cause of this condition is not known. What increases the risk? Having a disease of the intestines. Having Crohn's disease. Using aspirin or ibuprofen and other NSAIDs to treat other conditions. Stress. What are the signs or  symptoms? Pain in your stomach. A burning feeling in your stomach. Feeling like you may vomit (nauseous). Vomiting or vomiting blood. Feeling too full after you eat. Weight loss. Bad breath. Blood in your poop (stool). In some cases, there are no symptoms. How is this treated? This condition is treated with medicines. The medicines that are used depend on what caused the condition. You may be given: Antibiotic medicine, if your condition was caused by an infection from germs. H2 blockers and similar  medicines, if your condition was caused by too much acid in the stomach. Treatment may also include stopping the use of certain medicines, such as aspirin or ibuprofen. Follow these instructions at home: Medicines Take over-the-counter and prescription medicines only as told by your doctor. If you were prescribed an antibiotic medicine, take it as told by your doctor. Do not stop taking it even if you start to feel better. Alcohol use Do not drink alcohol if: Your doctor tells you not to drink. You are pregnant, may be pregnant, or are planning to become pregnant. If you drink alcohol: Limit your use to: 0-1 drink a day for women. 0-2 drinks a day for men. Know how much alcohol is in your drink. In the [place] one drink equals one 12 oz bottle of beer (355 mL), one 5 oz glass of wine (148 mL), or one 1 oz glass of hard liquor (44 mL). General instructions  Eat small meals often, instead of large meals. Avoid foods and drinks that make you feel worse. Drink enough fluid to keep your pee (urine) pale yellow. Talk with your doctor about ways to manage stress. You can exercise or do deep breathing, meditation, or yoga. Do not smoke or use any products that contain nicotine or tobacco. If you need help quitting, ask your doctor. Keep all follow-up visits. Contact a doctor if: Your symptoms get worse. Your stomach pain gets worse. Your symptoms go away and then come back. You have a fever. Get help right away if: You vomit blood or something that looks like coffee grounds. You have black or dark red poop. You throw up any time you try to drink fluids. These symptoms may be an emergency. Get help right away. Call your local emergency services (911 in the [place] Do not wait to see if the symptoms will go away. Do not drive yourself to the hospital. Summary Gastritis is irritation and swelling (inflammation) of the stomach. You must get help for this condition. If you do not get help, your  stomach can bleed, and you can get sores (ulcers) in your stomach. You can be treated with medicines for germs or medicines to block too much acid in your stomach. This information is not intended to replace advice given to you by your health care provider. Make sure you discuss any questions you have with your health care provider. Document Revised: 01/21/2021 Document Reviewed: 01/21/2021 Elsevier Patient Education  [place].    If you have been instructed to have an in-person evaluation today at a local Urgent Care facility, please use the link below. It will take you to a list of all of our available [facility], including address, phone number and hours of operation. Please do not delay care.  [facility]  If you or a family member do not have a primary care provider, use the link below to schedule a visit and establish care. When you choose a [facility] primary care physician or  advanced practice provider, you gain a long-term partner in health. Find a Primary Care Provider  Learn more about [facility]'s in-office and virtual care options: [facility]

## 2022-12-28 NOTE — Progress Notes (Signed)
Virtual Visit Consent   Tanya Higgins, you are scheduled for a virtual visit with a [facility] provider today. Just as with appointments in the office, your consent must be obtained to participate. Your consent will be active for this visit and any virtual visit you may have with one of our providers in the next 365 days. If you have a MyChart account, a copy of this consent can be sent to you electronically.  As this is a virtual visit, video technology does not allow for your provider to perform a traditional examination. This may limit your provider's ability to fully assess your condition. If your provider identifies any concerns that need to be evaluated in person or the need to arrange testing (such as labs, EKG, etc.), we will make arrangements to do so. Although advances in technology are sophisticated, we cannot ensure that it will always work on either your end or our end. If the connection with a video visit is poor, the visit may have to be switched to a telephone visit. With either a video or telephone visit, we are not always able to ensure that we have a secure connection.  By engaging in this virtual visit, you consent to the provision of healthcare and authorize for your insurance to be billed (if applicable) for the services provided during this visit. Depending on your insurance coverage, you may receive a charge related to this service.  I need to obtain your verbal consent now. Are you willing to proceed with your visit today? Tanya Higgins has provided verbal consent on 12/28/2022 for a virtual visit (video or telephone). Mar Daring, PA-C  Date: 12/28/2022 2:37 PM  Virtual Visit via Video Note   I, Mar Daring, connected with  Tanya Higgins  ([id number], 2001/07/21) on 12/28/22 at  2:30 PM EDT by a video-enabled telemedicine application and verified that I am speaking with the correct person using two identifiers.  Location: Patient: Virtual Visit  Location Patient: Home Provider: Virtual Visit Location Provider: Home Office   I discussed the limitations of evaluation and management by telemedicine and the availability of in person appointments. The patient expressed understanding and agreed to proceed.    History of Present Illness: Tanya Higgins is a 22 y.o. who identifies as a female who was assigned female at birth, and is being seen today for chest pains.  HPI: Chest Pain  This is a new problem. The current episode started in the past 7 days (few days). The onset quality is sudden. The problem occurs constantly. The problem has been gradually worsening. The pain is present in the substernal region. The pain is moderate. The quality of the pain is described as squeezing and pressure. The pain does not radiate. Associated symptoms include abdominal pain. Pertinent negatives include no cough, diaphoresis, fever, irregular heartbeat, lower extremity edema, nausea, orthopnea, palpitations, shortness of breath, sputum production, vomiting or weakness.   Just started a new job and having increased stress.    Problems:  Patient Active Problem List   Diagnosis Date Noted   Severe episode of recurrent major depressive disorder, without psychotic features ([place]) 10/08/2022   Blood in stool 01/10/2022   Hallucinations 06/19/2019   Loss of weight 06/19/2019   Panic 02/10/2018   Tension headache 08/16/2017   Migraine without aura and without status migrainosus, not intractable 08/14/2017   Major depression, recurrent, chronic ([place]) 08/14/2017   Allergic rhinitis, seasonal 06/17/2015   Anxiety 05/07/2015   Picky eater  05/07/2015   Isolated phobia 05/07/2015     Allergies: No Known Allergies Medications:  Current Outpatient Medications:    sucralfate (CARAFATE) 1 g tablet, Take 1 tablet (1 g total) by mouth 4 (four) times daily -  with meals and at bedtime., Disp: 40 tablet, Rfl: 0   benzonatate (TESSALON) 100 MG capsule, Take 2  capsules (200 mg total) by mouth 2 (two) times daily as needed for cough. (Patient not taking: Reported on 12/12/2022), Disp: 20 capsule, Rfl: 0   busPIRone (BUSPAR) 15 MG tablet, TAKE 1 TABLET TWICE A DAY, Disp: 180 tablet, Rfl: 0   cariprazine (VRAYLAR) 1.5 MG capsule, Take 1 capsule (1.5 mg total) by mouth daily., Disp: 30 capsule, Rfl: 0   DULoxetine (CYMBALTA) 60 MG capsule, TAKE 1 CAPSULE DAILY, Disp: 90 capsule, Rfl: 3   hydrOXYzine (ATARAX) 25 MG tablet, Take 1 tablet (25 mg total) by mouth every 8 (eight) hours as needed., Disp: 90 tablet, Rfl: 3   loratadine (CLARITIN) 10 MG tablet, Take 1 tablet (10 mg total) by mouth daily., Disp: 30 tablet, Rfl: 11   TROKENDI XR 50 MG CP24, TAKE 1 CAPSULE AT BEDTIME, Disp: 90 capsule, Rfl: 3  Observations/Objective: Patient is well-developed, well-nourished in no acute distress.  Resting comfortably at home.  Head is normocephalic, atraumatic.  No labored breathing.  Speech is clear and coherent with logical content.  Patient is alert and oriented at baseline.    Assessment and Plan: 1. Other acute gastritis without hemorrhage - sucralfate (CARAFATE) 1 g tablet; Take 1 tablet (1 g total) by mouth 4 (four) times daily -  with meals and at bedtime.  Dispense: 40 tablet; Refill: 0  2. Acute anxiety  - Suspect stress-induced gastritis - Continue anxiety medications as prescribed - Discussed adding in a half of hydroxyzine with acute squeezing sensations; she has not been using acutely only taking one at bedtime for sleep - Add Sucralfate  - Seek in person evaluation if symptoms are worsening or fail to improve  Follow Up Instructions: I discussed the assessment and treatment plan with the patient. The patient was provided an opportunity to ask questions and all were answered. The patient agreed with the plan and demonstrated an understanding of the instructions.  A copy of instructions were sent to the patient via MyChart unless otherwise noted  below.    The patient was advised to call back or seek an in-person evaluation if the symptoms worsen or if the condition fails to improve as anticipated.  Time:  I spent 12 minutes with the patient via telehealth technology discussing the above problems/concerns.    Mar Daring, PA-C

## 2023-01-09 ENCOUNTER — Ambulatory Visit: Payer: Self-pay

## 2023-01-09 NOTE — Telephone Encounter (Signed)
Chief Complaint: SOB, possible COVID Symptoms: headache, mild/moderate SOB with rest or activity, chills, body aches, hot/cold Frequency: This morning Pertinent Negatives: Patient denies other symptoms Disposition: [] ED /[] Urgent Care (no appt availability in office) / [x] Appointment(In office/virtual)/ []  [facility]/ [] Home Care/ [] Refused Recommended Disposition /[] [facility]/ []  Follow-up with PCP Additional Notes: Patient is currently scheduled in the office tomorrow at 1320 with the provider. During the triage she says she feels the same with the SOB as she did when she had COVID, she's waiting on a home test so that she can test. Advised to test and call back with the results to speak to a nurse so that we can determine the best disposition, either virtual UC today or change OV to virtual tomorrow. She verbalized understanding, care advice given to go to the ED with worsening SOB, take Tylenol/Ibuprofen for body aches/headache/fever, check temperature.    Reason for Disposition  MILD difficulty breathing (e.g., minimal/no SOB at rest, SOB with walking, pulse <100)  Answer Assessment - Initial Assessment Questions 1. RESPIRATORY STATUS: "Describe your breathing?" (e.g., wheezing, shortness of breath, unable to speak, severe coughing)      SOB 2. ONSET: "When did this breathing problem begin?"      This morning 3. PATTERN "Does the difficult breathing come and go, or has it been constant since it started?"      Comes and goes 4. SEVERITY: "How bad is your breathing?" (e.g., mild, moderate, severe)    - MILD: No SOB at rest, mild SOB with walking, speaks normally in sentences, can lie down, no retractions, pulse < 100.    - MODERATE: SOB at rest, SOB with minimal exertion and prefers to sit, cannot lie down flat, speaks in phrases, mild retractions, audible wheezing, pulse 100-120.    - SEVERE: Very SOB at rest, speaks in single words, struggling to breathe, sitting  hunched forward, retractions, pulse > 120      Mild-moderate 5. RECURRENT SYMPTOM: "Have you had difficulty breathing before?" If Yes, ask: "When was the last time?" and "What happened that time?"      When had covid, otherwise no 6. CARDIAC HISTORY: "Do you have any history of heart disease?" (e.g., heart attack, angina, bypass surgery, angioplasty)      No 7. LUNG HISTORY: "Do you have any history of lung disease?"  (e.g., pulmonary embolus, asthma, emphysema)     No 8. CAUSE: "What do you think is causing the breathing problem?"      Have all symptoms as when I had covid 9. OTHER SYMPTOMS: "Do you have any other symptoms? (e.g., dizziness, runny nose, cough, chest pain, fever)     Body aches, hot flashes/chills, constant headache that won't go away 10. O2 SATURATION MONITOR:  "Do you use an oxygen saturation monitor (pulse oximeter) at home?" If Yes, ask: "What is your reading (oxygen level) today?" "What is your usual oxygen saturation reading?" (e.g., 95%)       N/A 11. PREGNANCY: "Is there any chance you are pregnant?" "When was your last menstrual period?"       No  Answer Assessment - Initial Assessment Questions 1. COVID-19 DIAGNOSIS: "How do you know that you have COVID?" (e.g., positive lab test or self-test, diagnosed by doctor or NP/PA, symptoms after exposure).     Waiting on a test 2. COVID-19 EXPOSURE: "Was there any known exposure to COVID before the symptoms began?" CDC Definition of close contact: within 6 feet (2 meters) for  a total of 15 minutes or more over a 24-hour period.      Around someone who was around a person with COVID 3. ONSET: "When did the COVID-19 symptoms start?"      This morning 4. WORST SYMPTOM: "What is your worst symptom?" (e.g., cough, fever, shortness of breath, muscle aches)     SOB 5. COUGH: "Do you have a cough?" If Yes, ask: "How bad is the cough?"       Slight 6. FEVER: "Do you have a fever?" If Yes, ask: "What is your temperature, how was  it measured, and when did it start?"     I haven't checked, chills/hot 7. RESPIRATORY STATUS: "Describe your breathing?" (e.g., normal; shortness of breath, wheezing, unable to speak)      SOB at rest or with exertion 8. BETTER-SAME-WORSE: "Are you getting better, staying the same or getting worse compared to yesterday?"  If getting worse, ask, "In what way?"     N/A 9. OTHER SYMPTOMS: "Do you have any other symptoms?"  (e.g., chills, fatigue, headache, loss of smell or taste, muscle pain, sore throat)     Headache that won't go away, body aches, chills, hot/cold 10. HIGH RISK DISEASE: "Do you have any chronic medical problems?" (e.g., asthma, heart or lung disease, weak immune system, obesity, etc.)       No  Protocols used: Breathing Difficulty-A-AH, Coronavirus (COVID-19) Diagnosed or Suspected-A-AH

## 2023-01-09 NOTE — Telephone Encounter (Signed)
1800: Attempted to reach pt. for covid results, left VM to call back.

## 2023-01-10 ENCOUNTER — Ambulatory Visit (INDEPENDENT_AMBULATORY_CARE_PROVIDER_SITE_OTHER): Payer: BC Managed Care – PPO | Admitting: Family Medicine

## 2023-01-10 ENCOUNTER — Encounter: Payer: Self-pay | Admitting: Family Medicine

## 2023-01-10 VITALS — BP 112/68 | HR 111 | Temp 97.7°F | Resp 16 | Ht 64.0 in | Wt 131.2 lb

## 2023-01-10 DIAGNOSIS — J029 Acute pharyngitis, unspecified: Secondary | ICD-10-CM | POA: Diagnosis not present

## 2023-01-10 DIAGNOSIS — R509 Fever, unspecified: Secondary | ICD-10-CM

## 2023-01-10 LAB — POCT INFLUENZA A/B
Influenza A, POC: NEGATIVE
Influenza B, POC: NEGATIVE

## 2023-01-10 LAB — POCT RAPID STREP A (OFFICE): Rapid Strep A Screen: NEGATIVE

## 2023-01-10 NOTE — Telephone Encounter (Signed)
FYI

## 2023-01-10 NOTE — Patient Instructions (Signed)
Retest for COVID at home today if you can, and repeat again tomorrow if today is negative again Please let us know your results. We will be in contact with you about the strep culture and if you need antibiotics  Please rest, push fluids, continue to use tylenol and ibuprofen to help with fever and aches and pains.

## 2023-01-10 NOTE — Progress Notes (Signed)
Patient ID: Tanya Higgins, female    DOB: [birth year], 21 y.o.   MRN: [id number]  PCP: Berniece Salines, FNP  Chief Complaint  Patient presents with   Generalized Body Aches   Headache   Fever    The past 2 days, she did a home test for COVID yesterday it was negative, has been doing dayquil and ibuprofen at home   Sore Throat    Started today    Subjective:   Tanya Higgins is a 22 y.o. female, presents to clinic with CC of the following:  HPI  Pt here with 2 days of body aches, fever, HA, then sore throat started today She did home COVID test yesterday which was negative Sick contacts - no known sick contacts - maybe someone she was around was around someone else with covid When HA's/migraines are triggered pt uses otc excedrin in addition to her Rx for migraines  Patient Active Problem List   Diagnosis Date Noted   Severe episode of recurrent major depressive disorder, without psychotic features 10/08/2022   Blood in stool 01/10/2022   Hallucinations 06/19/2019   Loss of weight 06/19/2019   Panic 02/10/2018   Tension headache 08/16/2017   Migraine without aura and without status migrainosus, not intractable 08/14/2017   Major depression, recurrent, chronic 08/14/2017   Allergic rhinitis, seasonal 06/17/2015   Anxiety 05/07/2015   Picky eater 05/07/2015   Isolated phobia 05/07/2015      Current Outpatient Medications:    busPIRone (BUSPAR) 15 MG tablet, TAKE 1 TABLET TWICE A DAY, Disp: 180 tablet, Rfl: 0   cariprazine (VRAYLAR) 1.5 MG capsule, Take 1 capsule (1.5 mg total) by mouth daily., Disp: 30 capsule, Rfl: 0   DULoxetine (CYMBALTA) 60 MG capsule, TAKE 1 CAPSULE DAILY, Disp: 90 capsule, Rfl: 3   hydrOXYzine (ATARAX) 25 MG tablet, Take 1 tablet (25 mg total) by mouth every 8 (eight) hours as needed., Disp: 90 tablet, Rfl: 3   loratadine (CLARITIN) 10 MG tablet, Take 1 tablet (10 mg total) by mouth daily., Disp: 30 tablet, Rfl: 11   sucralfate (CARAFATE) 1 g  tablet, Take 1 tablet (1 g total) by mouth 4 (four) times daily -  with meals and at bedtime., Disp: 40 tablet, Rfl: 0   TROKENDI XR 50 MG CP24, TAKE 1 CAPSULE AT BEDTIME, Disp: 90 capsule, Rfl: 3   benzonatate (TESSALON) 100 MG capsule, Take 2 capsules (200 mg total) by mouth 2 (two) times daily as needed for cough. (Patient not taking: Reported on 12/12/2022), Disp: 20 capsule, Rfl: 0   No Known Allergies   Social History   Tobacco Use   Smoking status: Never   Smokeless tobacco: Never  Vaping Use   Vaping Use: Never used  Substance Use Topics   Alcohol use: No    Alcohol/week: 0.0 standard drinks of alcohol   Drug use: No      Chart Review Today: I personally reviewed active problem list, medication list, allergies, family history, social history, health maintenance, notes from last encounter, lab results, imaging with the patient/caregiver today.   Review of Systems  Constitutional: Negative.   HENT: Negative.    Eyes: Negative.   Respiratory: Negative.    Cardiovascular: Negative.   Gastrointestinal: Negative.   Endocrine: Negative.   Genitourinary: Negative.   Musculoskeletal: Negative.   Skin: Negative.   Allergic/Immunologic: Negative.   Neurological: Negative.   Hematological: Negative.   Psychiatric/Behavioral: Negative.    All other systems reviewed  and are negative.      Objective:   Vitals:   01/10/23 1316  BP: 112/68  Pulse: (!) 111  Resp: 16  Temp: 97.7 F (36.5 C)  TempSrc: Oral  SpO2: 99%  Weight: 131 lb 3.2 oz (59.5 kg)  Height: 5\' 4"  (1.626 m)    Body mass index is 22.52 kg/m.  Physical Exam Vitals and nursing note reviewed.  Constitutional:      General: She is not in acute distress.    Appearance: Normal appearance. She is well-developed. She is not ill-appearing, toxic-appearing or diaphoretic.  HENT:     Head: Normocephalic and atraumatic.     Right Ear: Hearing, tympanic membrane, ear canal and external ear normal.     Left  Ear: Hearing, tympanic membrane, ear canal and external ear normal.     Nose: Mucosal edema, congestion and rhinorrhea present.     Right Sinus: No maxillary sinus tenderness or frontal sinus tenderness.     Left Sinus: No maxillary sinus tenderness or frontal sinus tenderness.     Mouth/Throat:     Mouth: Mucous membranes are moist. Mucous membranes are not pale.     Pharynx: Uvula midline. Posterior oropharyngeal erythema (mild) present. No oropharyngeal exudate or uvula swelling.     Tonsils: No tonsillar abscesses.  Eyes:     General: No scleral icterus.       Right eye: No discharge.        Left eye: No discharge.     Conjunctiva/sclera: Conjunctivae normal.  Neck:     Trachea: No tracheal deviation.  Cardiovascular:     Rate and Rhythm: Normal rate and regular rhythm.     Pulses: Normal pulses.     Heart sounds: Normal heart sounds. No murmur heard.    No friction rub. No gallop.  Pulmonary:     Effort: Pulmonary effort is normal. No respiratory distress.     Breath sounds: No stridor. No wheezing, rhonchi or rales.  Abdominal:     General: Bowel sounds are normal. There is no distension.     Palpations: Abdomen is soft.  Musculoskeletal:     Cervical back: Normal range of motion and neck supple.  Lymphadenopathy:     Cervical: No cervical adenopathy.  Skin:    General: Skin is warm and dry.     Coloration: Skin is not pale.     Findings: No rash.  Neurological:     Mental Status: She is alert.     Motor: No abnormal muscle tone.     Coordination: Coordination normal.  Psychiatric:        Mood and Affect: Mood normal.        Behavior: Behavior normal.      Results for orders placed or performed in visit on 01/25/22  CALPROTECTIN  Result Value Ref Range   Calprotectin 74 mcg/g       Assessment & Plan:   Febrile illness with bodyaches, headaches, sore throat onset about 2 days ago, and 22 year old female with history of migraines Results for orders placed or  performed in visit on 01/10/23  POCT rapid strep A  Result Value Ref Range   Rapid Strep A Screen Negative Negative  POCT Influenza A/B  Result Value Ref Range   Influenza A, POC Negative Negative   Influenza B, POC Negative Negative     1. Fever, unspecified fever cause Home covid test x 1 neg, encouraged pt to do repeat test and let us know  In office flu and strep negative Strep culture sent out Otherwise pt well appearing, she is going to rest, stay out of work/school while ill/febrile and push fluids, rest, use Tylenol ibuprofen for pains and fever If strep culture is positive antibiotics will be sent in - POCT rapid strep A - POCT Influenza A/B - Culture, Group A Strep  2. Sore throat Currently etiology unclear, strep culture sent rapid strep negative flu negative and 1 COVID test negative - POCT rapid strep A - Culture, Group A Strep  3. Febrile illness - Culture, Group A Strep      Danelle Berry, PA-C 01/10/23 1:26 PM

## 2023-01-12 LAB — CULTURE, GROUP A STREP
MICRO NUMBER:: 14814129
SPECIMEN QUALITY:: ADEQUATE

## 2023-01-15 ENCOUNTER — Other Ambulatory Visit: Payer: Self-pay | Admitting: Nurse Practitioner

## 2023-01-15 ENCOUNTER — Encounter: Payer: Self-pay | Admitting: Nurse Practitioner

## 2023-01-15 DIAGNOSIS — F419 Anxiety disorder, unspecified: Secondary | ICD-10-CM

## 2023-01-15 DIAGNOSIS — F332 Major depressive disorder, recurrent severe without psychotic features: Secondary | ICD-10-CM

## 2023-01-15 MED ORDER — CARIPRAZINE HCL 1.5 MG PO CAPS: 1.5000 mg | ORAL_CAPSULE | Freq: Every day | ORAL | 1 refills | Status: DC

## 2023-02-01 ENCOUNTER — Encounter: Payer: Self-pay | Admitting: Nurse Practitioner

## 2023-02-01 ENCOUNTER — Other Ambulatory Visit: Payer: Self-pay | Admitting: Nurse Practitioner

## 2023-02-01 DIAGNOSIS — F332 Major depressive disorder, recurrent severe without psychotic features: Secondary | ICD-10-CM

## 2023-02-01 DIAGNOSIS — F419 Anxiety disorder, unspecified: Secondary | ICD-10-CM

## 2023-02-01 MED ORDER — CARIPRAZINE HCL 1.5 MG PO CAPS: 1.5000 mg | ORAL_CAPSULE | Freq: Every day | ORAL | 1 refills | Status: DC

## 2023-02-12 ENCOUNTER — Ambulatory Visit (INDEPENDENT_AMBULATORY_CARE_PROVIDER_SITE_OTHER): Payer: Self-pay | Admitting: Family Medicine

## 2023-02-12 DIAGNOSIS — J302 Other seasonal allergic rhinitis: Secondary | ICD-10-CM

## 2023-02-12 DIAGNOSIS — J029 Acute pharyngitis, unspecified: Secondary | ICD-10-CM

## 2023-02-12 DIAGNOSIS — Z1159 Encounter for screening for other viral diseases: Secondary | ICD-10-CM

## 2023-02-12 DIAGNOSIS — Z113 Encounter for screening for infections with a predominantly sexual mode of transmission: Secondary | ICD-10-CM

## 2023-02-12 DIAGNOSIS — Z114 Encounter for screening for human immunodeficiency virus [HIV]: Secondary | ICD-10-CM

## 2023-02-12 NOTE — Progress Notes (Signed)
PT no showed for appt

## 2023-03-09 ENCOUNTER — Encounter: Payer: Self-pay | Admitting: Nurse Practitioner

## 2023-05-10 ENCOUNTER — Ambulatory Visit: Payer: 59 | Admitting: Family Medicine

## 2023-05-10 VITALS — BP 114/72 | HR 118 | Temp 98.6°F | Resp 16 | Ht 64.0 in | Wt 153.0 lb

## 2023-05-10 DIAGNOSIS — J020 Streptococcal pharyngitis: Secondary | ICD-10-CM | POA: Diagnosis not present

## 2023-05-10 LAB — POCT RAPID STREP A (OFFICE): Rapid Strep A Screen: POSITIVE — AB

## 2023-05-10 MED ORDER — AMOXICILLIN 500 MG PO TABS
500.0000 mg | ORAL_TABLET | Freq: Two times a day (BID) | ORAL | 0 refills | Status: AC
Start: 2023-05-10 — End: 2023-05-20

## 2023-05-10 NOTE — Patient Instructions (Signed)
Take the antibiotics until completed Follow up if not having improvement in the next 3-5 days After 24 hours on antibiotics you should not be contagious. You should replace you toothbrush after 24-48 hours on antibiotics. We have been seeing a lot of co -infections with viruses plus strep throat so some symptoms may linger for 1-2 weeks, however if you still have bad sore throat, fevers, any new rashes, or unimproving moderate to severe symptoms, we do want to recheck you in office.  Hope you feel better.   Strep Throat, Adult Strep throat is an infection of the throat. It is caused by germs (bacteria). Strep throat is common during the cold months of the year. It mostly affects children who are 110-1 years old. However, people of all ages can get it at any time of the year. This infection spreads from person to person through coughing, sneezing, or having close contact. What are the causes? This condition is caused by the Streptococcus pyogenes germ. What increases the risk? 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 Children are more likely to get strep throat and may spread it to others. You go to crowded places. Germs can spread easily in such places. You kiss or touch someone who has strep throat. What are the signs or symptoms? Fever or chills. Redness, swelling, or pain in the tonsils or throat. Pain or trouble when swallowing. White or yellow spots on the tonsils or throat. Tender glands in the neck and under the jaw. Bad breath. Red rash all over the body. This is rare. How is this treated? Medicines that kill germs (antibiotics). Medicines that treat pain or fever. These include: Ibuprofen or acetaminophen. Aspirin, only for people who are over the age of 32. Cough drops. Throat sprays. Follow these instructions at home: Medicines  Take over-the-counter and prescription medicines only as told by your doctor. Take your antibiotic medicine as told by your doctor. Do not stop taking the  antibiotic even if you start to feel better. Eating and drinking  If you have trouble swallowing, eat soft foods until your throat feels better. Drink enough fluid to keep your pee (urine) pale yellow. To help with pain, you may have: Warm fluids, such as soup and tea. Cold fluids, such as frozen desserts or popsicles. General instructions Rinse your mouth (gargle) with a salt-water mixture 3-4 times a day or as needed. To make a salt-water mixture, dissolve -1 tsp (3-6 g) of salt in 1 cup (237 mL) of warm water. Rest as much as you can. Stay home from work or school until you have been taking antibiotics for 24 hours. Do not smoke or use any products that contain nicotine or tobacco. If you need help quitting, ask your doctor. Keep all follow-up visits. How is this prevented?  Do not share food, drinking cups, or personal items. They can cause the germs to spread. Wash your hands well with soap and water. Make sure that all people in your house wash their hands well. Have family members tested if they have a fever or a sore throat. They may need an antibiotic if they have strep throat. Contact a doctor if: You have swelling in your neck that keeps getting bigger. You get a rash, cough, or earache. You cough up a thick fluid that is green, yellow-brown, or bloody. You have pain that does not get better with medicine. Your symptoms get worse instead of getting better. You have a fever. Get help right away if: You vomit. You have  a very bad headache. Your neck hurts or feels stiff. You have chest pain or are short of breath. You have drooling, very bad throat pain, or changes in your voice. Your neck is swollen, or the skin gets red and tender. Your mouth is dry, or you are peeing less than normal. You keep feeling more tired or have trouble waking up. Your joints are red or painful. These symptoms may be an emergency. Do not wait to see if the symptoms will go away. Get help right  away. Call your local emergency services (911 in the [place] Summary Strep throat is an infection of the throat. It is caused by germs (bacteria). This infection can spread from person to person through coughing, sneezing, or having close contact. Take your medicines, including antibiotics, as told by your doctor. Do not stop taking the antibiotic even if you start to feel better. To prevent the spread of germs, wash your hands well with soap and water. Have others do the same. Do not share food, drinking cups, or personal items. Get help right away if you have a bad headache, chest pain, shortness of breath, a stiff or painful neck, or you vomit. This information is not intended to replace advice given to you by your health care provider. Make sure you discuss any questions you have with your health care provider. Document Revised: 01/10/2021 Document Reviewed: 01/10/2021 Elsevier Patient Education  2024 [employer].

## 2023-05-10 NOTE — Progress Notes (Signed)
Patient ID: Tanya Higgins, female    DOB: [birth year], 22 y.o.   MRN: [id number]  PCP: Danelle Berry, PA-C  Chief Complaint  Patient presents with   Sore Throat    Started yesterday   Nasal Congestion   Cough    Subjective:   Tanya Higgins is a 22 y.o. female, presents to clinic with CC of the following:  HPI  Sore throat, positive for strep today Sx started yesterday with body aches, sore throat and feeling generally bad She is able to swallow but it hurts No rash, fever, abd pain, HA Mild congestion  Patient Active Problem List   Diagnosis Date Noted   Severe episode of recurrent major depressive disorder, without psychotic features (HCC) 10/08/2022   Blood in stool 01/10/2022   Hallucinations 06/19/2019   Loss of weight 06/19/2019   Panic 02/10/2018   Tension headache 08/16/2017   Migraine without aura and without status migrainosus, not intractable 08/14/2017   Major depression, recurrent, chronic (HCC) 08/14/2017   Allergic rhinitis, seasonal 06/17/2015   Anxiety 05/07/2015   Picky eater 05/07/2015   Isolated phobia 05/07/2015      Current Outpatient Medications:    busPIRone (BUSPAR) 15 MG tablet, TAKE 1 TABLET TWICE A DAY, Disp: 180 tablet, Rfl: 0   cariprazine (VRAYLAR) 1.5 MG capsule, Take 1 capsule (1.5 mg total) by mouth daily., Disp: 90 capsule, Rfl: 1   DULoxetine (CYMBALTA) 60 MG capsule, TAKE 1 CAPSULE DAILY, Disp: 90 capsule, Rfl: 3   hydrOXYzine (ATARAX) 25 MG tablet, Take 1 tablet (25 mg total) by mouth every 8 (eight) hours as needed., Disp: 90 tablet, Rfl: 3   loratadine (CLARITIN) 10 MG tablet, Take 1 tablet (10 mg total) by mouth daily., Disp: 30 tablet, Rfl: 11   TROKENDI XR 50 MG CP24, TAKE 1 CAPSULE AT BEDTIME, Disp: 90 capsule, Rfl: 3   No Known Allergies   Social History   Tobacco Use   Smoking status: Never   Smokeless tobacco: Never  Vaping Use   Vaping status: Never Used  Substance Use Topics   Alcohol use: No     Alcohol/week: 0.0 standard drinks of alcohol   Drug use: No      Chart Review Today: I personally reviewed active problem list, medication list, allergies, family history, social history, health maintenance, notes from last encounter, lab results, imaging with the patient/caregiver today.   Review of Systems  Constitutional: Negative.   HENT: Negative.    Eyes: Negative.   Respiratory: Negative.    Cardiovascular: Negative.   Gastrointestinal: Negative.   Endocrine: Negative.   Genitourinary: Negative.   Musculoskeletal: Negative.   Skin: Negative.   Allergic/Immunologic: Negative.   Neurological: Negative.   Hematological: Negative.   Psychiatric/Behavioral: Negative.    All other systems reviewed and are negative.      Objective:   Vitals:   05/10/23 1328 05/10/23 1329  BP: 114/72   Pulse: (!) 126 (!) 118  Resp: 16   Temp: 98.6 F (37 C)   TempSrc: Oral   SpO2: 100%   Weight: 153 lb (69.4 kg)   Height: 5\' 4"  (1.626 m)     Body mass index is 26.26 kg/m.  Physical Exam Vitals and nursing note reviewed.  Constitutional:      Appearance: She is well-developed.  HENT:     Head: Normocephalic and atraumatic.     Right Ear: External ear normal.     Left Ear: External ear normal.  Nose: Nose normal. No congestion or rhinorrhea.     Mouth/Throat:     Mouth: Mucous membranes are moist.     Pharynx: Uvula midline. Posterior oropharyngeal erythema present. No oropharyngeal exudate.     Tonsils: No tonsillar exudate. 2+ on the right. 2+ on the left.  Eyes:     General: No scleral icterus.       Right eye: No discharge.        Left eye: No discharge.     Conjunctiva/sclera: Conjunctivae normal.  Neck:     Trachea: No tracheal deviation.  Cardiovascular:     Rate and Rhythm: Regular rhythm. Tachycardia present.     Pulses: Normal pulses.     Heart sounds: Normal heart sounds. No murmur heard.    No friction rub. No gallop.  Pulmonary:     Effort: Pulmonary  effort is normal. No respiratory distress.     Breath sounds: No stridor.  Musculoskeletal:        General: Normal range of motion.  Lymphadenopathy:     Cervical: Cervical adenopathy present.  Skin:    General: Skin is warm and dry.     Findings: No rash.  Neurological:     Mental Status: She is alert.     Motor: No abnormal muscle tone.     Coordination: Coordination normal.  Psychiatric:        Behavior: Behavior normal.      Results for orders placed or performed in visit on 05/10/23  POCT rapid strep A  Result Value Ref Range   Rapid Strep A Screen Positive (A) Negative       Assessment & Plan:   1. Strep pharyngitis Onset yesterday, able to swallow, afebrile, mild tachycardia, appears well hydrated Encouraged her to cycle/schedule NSAIDs and tylenol to help with pain and start Abx asap Reviewed expected time to feel better, when she can return to work and f/up precautions - POCT rapid strep A - amoxicillin (AMOXIL) 500 MG tablet; Take 1 tablet (500 mg total) by mouth 2 (two) times daily for 10 days.  Dispense: 20 tablet; Refill: 0      Danelle Berry, PA-C 05/10/23 1:40 PM

## 2023-07-05 ENCOUNTER — Ambulatory Visit: Payer: 59 | Admitting: Nurse Practitioner

## 2023-07-05 NOTE — Progress Notes (Deleted)
   There were no vitals taken for this visit.   Subjective:    Patient ID: Tanya Higgins, female    DOB: 04-28-2001, 22 y.o.   MRN: [id number]  HPI: Tanya Higgins is a 22 y.o. female  No chief complaint on file.   Relevant past medical, surgical, family and social history reviewed and updated as indicated. Interim medical history since our last visit reviewed. Allergies and medications reviewed and updated.  Review of Systems  Constitutional: Negative for fever or weight change.  Respiratory: Negative for cough and shortness of breath.   Cardiovascular: Negative for chest pain or palpitations.  Gastrointestinal: Negative for abdominal pain, no bowel changes.  Musculoskeletal: Negative for gait problem or joint swelling.  Skin: Negative for rash.  Neurological: Negative for dizziness or headache.  No other specific complaints in a complete review of systems (except as listed in HPI above).      Objective:    There were no vitals taken for this visit.  Wt Readings from Last 3 Encounters:  05/10/23 153 lb (69.4 kg)  01/10/23 131 lb 3.2 oz (59.5 kg)  12/12/22 120 lb (54.4 kg)    Physical Exam  Constitutional: Patient appears well-developed and well-nourished. Obese *** No distress.  HEENT: head atraumatic, normocephalic, pupils equal and reactive to light, ears ***, neck supple, throat within normal limits Cardiovascular: Normal rate, regular rhythm and normal heart sounds.  No murmur heard. No BLE edema. Pulmonary/Chest: Effort normal and breath sounds normal. No respiratory distress. Abdominal: Soft.  There is no tenderness. Psychiatric: Patient has a normal mood and affect. behavior is normal. Judgment and thought content normal.  Results for orders placed or performed in visit on 05/10/23  POCT rapid strep A  Result Value Ref Range   Rapid Strep A Screen Positive (A) Negative      Assessment & Plan:   Problem List Items Addressed This Visit   None    Follow  up plan: No follow-ups on file.

## 2023-07-26 ENCOUNTER — Encounter: Payer: Self-pay | Admitting: Family Medicine

## 2023-07-26 DIAGNOSIS — F419 Anxiety disorder, unspecified: Secondary | ICD-10-CM

## 2023-07-26 DIAGNOSIS — F332 Major depressive disorder, recurrent severe without psychotic features: Secondary | ICD-10-CM

## 2023-07-26 MED ORDER — CARIPRAZINE HCL 1.5 MG PO CAPS: 1.5000 mg | ORAL_CAPSULE | Freq: Every day | ORAL | 0 refills | Status: DC

## 2023-07-29 ENCOUNTER — Other Ambulatory Visit: Payer: Self-pay | Admitting: Nurse Practitioner

## 2023-07-29 DIAGNOSIS — F419 Anxiety disorder, unspecified: Secondary | ICD-10-CM

## 2023-07-29 DIAGNOSIS — F332 Major depressive disorder, recurrent severe without psychotic features: Secondary | ICD-10-CM

## 2023-07-29 MED ORDER — CARIPRAZINE HCL 1.5 MG PO CAPS: 1.5000 mg | ORAL_CAPSULE | Freq: Every day | ORAL | 1 refills | Status: DC

## 2023-09-17 ENCOUNTER — Encounter: Payer: Self-pay | Admitting: Nurse Practitioner

## 2023-09-17 ENCOUNTER — Other Ambulatory Visit: Payer: Self-pay | Admitting: Nurse Practitioner

## 2023-09-17 DIAGNOSIS — F5104 Psychophysiologic insomnia: Secondary | ICD-10-CM

## 2023-09-17 DIAGNOSIS — F339 Major depressive disorder, recurrent, unspecified: Secondary | ICD-10-CM

## 2023-09-18 MED ORDER — BUSPIRONE HCL 15 MG PO TABS: 15.0000 mg | ORAL_TABLET | Freq: Two times a day (BID) | ORAL | 0 refills | Status: AC

## 2023-09-18 MED ORDER — HYDROXYZINE HCL 25 MG PO TABS: 25.0000 mg | ORAL_TABLET | Freq: Three times a day (TID) | ORAL | 3 refills | Status: DC | PRN

## 2023-10-10 ENCOUNTER — Other Ambulatory Visit: Payer: Self-pay | Admitting: Nurse Practitioner

## 2023-10-10 DIAGNOSIS — F5104 Psychophysiologic insomnia: Secondary | ICD-10-CM

## 2023-10-14 NOTE — Telephone Encounter (Signed)
 Last reordered 09/18/23 #90 3 RF  Requested Prescriptions  Refused Prescriptions Disp Refills   hydrOXYzine  (ATARAX ) 25 MG tablet [Pharmacy Med Name: HYDROXYZINE  HCL 25 MG TABLET] 270 tablet 2    Sig: TAKE 1 TABLET BY MOUTH EVERY 8 HOURS AS NEEDED.     Ear, Nose, and Throat:  Antihistamines 2 Failed - 10/14/2023 11:02 AM      Failed - Cr in normal range and within 360 days    Creat  Date Value Ref Range Status  01/10/2022 0.62 0.50 - 0.96 mg/dL Final         Passed - Valid encounter within last 12 months    Recent Outpatient Visits           5 months ago Strep pharyngitis   [facility] Leavy Mole, PA-C   8 months ago Seasonal allergic rhinitis, unspecified trigger   [facility] Leavy Mole, PA-C   9 months ago Febrile illness   [facility] Leavy Mole, PA-C   10 months ago COVID-19   [facility] Gareth Mliss FALCON, FNP   1 year ago Anxiety   [facility] Gareth Mliss FALCON, [place]

## 2023-12-14 ENCOUNTER — Other Ambulatory Visit: Payer: Self-pay | Admitting: Nurse Practitioner

## 2023-12-14 DIAGNOSIS — F339 Major depressive disorder, recurrent, unspecified: Secondary | ICD-10-CM

## 2023-12-16 NOTE — Telephone Encounter (Signed)
 Requested medication (s) are due for refill today: yes   Requested medication (s) are on the active medication list: yes   Last refill:  09/18/23 #180 0 refills  Future visit scheduled: no   Notes to clinic:  no refills remain do you want to refill Rx?     Requested Prescriptions  Pending Prescriptions Disp Refills   busPIRone (BUSPAR) 15 MG tablet [Pharmacy Med Name: BUSPIRONE HCL 15 MG TABLET] 180 tablet 0    Sig: TAKE 1 TABLET BY MOUTH 2 TIMES DAILY.     Psychiatry: Anxiolytics/Hypnotics - Non-controlled Passed - 12/16/2023  1:44 PM      Passed - Valid encounter within last 12 months    Recent Outpatient Visits           7 months ago Strep pharyngitis   [facility] Danelle Berry, PA-C   10 months ago Seasonal allergic rhinitis, unspecified trigger   [facility] Danelle Berry, PA-C   11 months ago Febrile illness   [facility] Danelle Berry, PA-C   1 year ago COVID-19   [facility] Berniece Salines, FNP   1 year ago Anxiety   [facility] Berniece Salines, [place]

## 2023-12-26 ENCOUNTER — Telehealth

## 2024-01-08 ENCOUNTER — Other Ambulatory Visit: Payer: Self-pay | Admitting: Nurse Practitioner

## 2024-01-08 DIAGNOSIS — F332 Major depressive disorder, recurrent severe without psychotic features: Secondary | ICD-10-CM

## 2024-01-08 DIAGNOSIS — F419 Anxiety disorder, unspecified: Secondary | ICD-10-CM

## 2024-01-08 NOTE — Telephone Encounter (Signed)
 Requested medication (s) are due for refill today: Yes  Requested medication (s) are on the active medication list: Yes  Last refill:  07/29/23  Future visit scheduled: Yes  Notes to clinic:  Unable to refill due to no refill protocol for this medication.      Requested Prescriptions  Pending Prescriptions Disp Refills   VRAYLAR 1.5 MG capsule [Pharmacy Med Name: VRAYLAR 1.5 MG CAPSULE] 30 capsule 5    Sig: TAKE 1 CAPSULE BY MOUTH DAILY.     Off-Protocol Failed - 01/08/2024  1:22 PM      Failed - Medication not assigned to a protocol, review manually.      Failed - Valid encounter within last 12 months    Recent Outpatient Visits   None

## 2024-01-08 NOTE — Progress Notes (Signed)
 Name: Dodi Leu   MRN: [id number]    DOB: 2001-08-16   Date:01/09/2024       Progress Note  Subjective  Chief Complaint  Chief Complaint  Patient presents with   Annual Exam   Obesity    HPI  Patient presents for annual CPE and discuss weight loss medication  Discussed the use of AI scribe software for clinical note transcription with the patient, who gave verbal consent to proceed.  History of Present Illness Danee Soller is a 23 year old female who presents for a physical and to discuss weight loss medication.  She is experiencing weight gain despite eating somewhat healthily and exercising three to four times a week. The weight gain is primarily in her breasts and thighs, leading to back pain. She identifies stress and emotional eating as contributing factors.  She has a history of depression and anxiety, with positive screenings for both conditions. She is currently taking Vraylar  1.5 mg daily, Buspar  15 mg twice daily, hydroxyzine  25 mg every eight hours, and Cymbalta  60 mg daily. Her anxiety and depression levels are very high today, and she is in the process of finding a psychiatrist, as her therapist is fully booked. She describes herself as extremely depressed.  In terms of lifestyle, she does not consume vegetables but eats other food groups. She reports difficulty sleeping nightly but manages to get eight to nine hours of sleep. She has not been to the dentist in almost a year and is due for an eye exam. She is not currently sexually active and has no history of pancreatitis or family history of thyroid cancer. Her last menstrual cycle started on March 10th, and she is expecting her next cycle to start tomorrow.    Diet: does not eat  veggies, but eats all the other food groups Exercise: 3-4 days a week. recommend 150 min of physical activity weekly    Sleep: 8 - 9 hours Last dental exam:about a year ago Last eye exam: due  Flowsheet Row Office Visit from  01/09/2024 in [facility]  AUDIT-C Score 1       Depression: Phq 9 is  positive, currently on medication vraylar  1.5 mg daily, buspar  15 mg BID, hydroxyzine  25 mg every 8 hours, Cymbalta  60 mg daily.  Recommend patient get established with psychiatry.     01/09/2024   11:04 AM 05/10/2023    1:28 PM 01/10/2023    1:16 PM 11/22/2022    1:35 PM 10/08/2022    1:30 PM  Depression screen PHQ 2/9  Decreased Interest 3 1 0 0 2  Down, Depressed, Hopeless 2 1 0 0 3  PHQ - 2 Score 5 2 0 0 5  Altered sleeping 3 1 0  1  Tired, decreased energy 3 1 0  2  Change in appetite 3 1 0  3  Feeling bad or failure about yourself  3 1 0  3  Trouble concentrating 1 0 0  2  Moving slowly or fidgety/restless 1 0 0  3  Suicidal thoughts 2 0 0  2  PHQ-9 Score 21 6 0  21  Difficult doing work/chores Somewhat difficult Very difficult Not difficult at all  Very difficult      01/09/2024   11:04 AM 05/10/2023    1:28 PM 10/08/2022    1:31 PM 01/24/2022    2:03 PM  GAD 7 : Generalized Anxiety Score  Nervous, Anxious, on Edge 1 2 3  1  Control/stop worrying 1 2 0 0  Worry too much - different things 1 2 2  0  Trouble relaxing 3 2 1 1   Restless 3 2 1 3   Easily annoyed or irritable 2 2 3  0  Afraid - awful might happen 1 2 2 1   Total GAD 7 Score 12 14 12 6   Anxiety Difficulty Somewhat difficult Very difficult Somewhat difficult Not difficult at all     Hypertension: BP Readings from Last 3 Encounters:  01/09/24 116/72  05/10/23 114/72  01/10/23 112/68   Obesity: Wt Readings from Last 3 Encounters:  01/09/24 162 lb 1.6 oz (73.5 kg)  05/10/23 153 lb (69.4 kg)  01/10/23 131 lb 3.2 oz (59.5 kg)   BMI Readings from Last 3 Encounters:  01/09/24 27.82 kg/m  05/10/23 26.26 kg/m  01/10/23 22.52 kg/m     Vaccines:  HPV: up to at age 65 , ask insurance if age between 60-45  Shingrix: 15-64 yo and ask insurance if covered when patient above 61 yo Pneumonia:  educated and discussed with  patient. Flu:  educated and discussed with patient.  Hep C Screening: due STD testing and prevention (HIV/chl/gon/syphilis): due Intimate partner violence:none Sexual History : not currently sexually active Menstrual History/LMP/Abnormal Bleeding: [facility]: 12/09/2023 Incontinence Symptoms: none  Breast cancer:  - Last Mammogram: does not qualify - BRCA gene screening: none  Osteoporosis: Discussed high calcium and vitamin D supplementation, weight bearing exercises  Cervical cancer screening: due  Skin cancer: Discussed monitoring for atypical lesions  Colorectal cancer: does not qualify   Lung cancer:   Low Dose CT Chest recommended if Age 68-80 years, 20 pack-year currently smoking OR have quit w/in 15years. Patient does not qualify.   ECG: none  Advanced Care Planning: A voluntary discussion about advance care planning including the explanation and discussion of advance directives.  Discussed health care proxy and Living will, and the patient was able to identify a health care proxy as mom.  Patient does not have a living will at present time. If patient does have living will, I have requested they bring this to the clinic to be scanned in to their chart.  Lipids: Lab Results  Component Value Date   CHOL 140 08/01/2018   No results found for: HDL No results found for: LDLCALC No results found for: TRIG No results found for: CHOLHDL No results found for: LDLDIRECT  Glucose: Glucose, Bld  Date Value Ref Range Status  01/10/2022 87 65 - 99 mg/dL Final    Comment:    .            Fasting reference interval .   10/30/2020 113 (H) 70 - 99 mg/dL Final    Comment:    Glucose reference range applies only to samples taken after fasting for at least 8 hours.  01/10/2020 153 (H) 70 - 99 mg/dL Final    Comment:    Glucose reference range applies only to samples taken after fasting for at least 8 hours.    Patient Active Problem List   Diagnosis Date Noted    Overweight (BMI 25.0-29.9) 01/09/2024   Severe episode of recurrent major depressive disorder, without psychotic features (HCC) 10/08/2022   Blood in stool 01/10/2022   Hallucinations 06/19/2019   Loss of weight 06/19/2019   Panic 02/10/2018   Tension headache 08/16/2017   Migraine without aura and without status migrainosus, not intractable 08/14/2017   Allergic rhinitis, seasonal 06/17/2015   Anxiety 05/07/2015   Picky eater 05/07/2015  Isolated phobia 05/07/2015    Past Surgical History:  Procedure Laterality Date   FRACTURE SURGERY      Family History  Problem Relation Age of Onset   Depression Mother    Hypertension Mother    Asthma Mother    Migraines Mother    Diabetes Mother    Alcohol abuse Father    Diabetes Sister     Social History   Socioeconomic History   Marital status: Single    Spouse name: Not on file   Number of children: Not on file   Years of education: Not on file   Highest education level: Some college, no degree  Occupational History   Occupation: consulting civil engineer   Tobacco Use   Smoking status: Passive Smoke Exposure - Never Smoker   Smokeless tobacco: Never   Tobacco comments:    i havent smoked a cigarette in months, didnt like it  Vaping Use   Vaping status: Never Used  Substance and Sexual Activity   Alcohol use: No   Drug use: No   Sexual activity: Not Currently    Birth control/protection: None  Other Topics Concern   Not on file  Social History Narrative   Patrina is a 12 th grade student.   She attends [employer], but virtually since 10/2018    She lives with both parents. She has one sister.   Social Drivers of Health   Financial Resource Strain: Patient Declined (01/07/2024)   Overall Financial Resource Strain (CARDIA)    Difficulty of Paying Living Expenses: Patient declined  Food Insecurity: No Food Insecurity (01/09/2024)   Hunger Vital Sign    Worried About Running Out of Food in the Last Year: Never true    Ran  Out of Food in the Last Year: Never true  Recent Concern: Food Insecurity - Food Insecurity Present (01/07/2024)   Hunger Vital Sign    Worried About Running Out of Food in the Last Year: Sometimes true    Ran Out of Food in the Last Year: Sometimes true  Transportation Needs: No Transportation Needs (01/09/2024)   PRAPARE - Administrator, Civil Service (Medical): No    Lack of Transportation (Non-Medical): No  Recent Concern: Transportation Needs - Unmet Transportation Needs (01/07/2024)   PRAPARE - Transportation    Lack of Transportation (Medical): No    Lack of Transportation (Non-Medical): Yes  Physical Activity: Inactive (01/09/2024)   Exercise Vital Sign    Days of Exercise per Week: 0 days    Minutes of Exercise per Session: 0 min  Stress: Stress Concern Present (01/07/2024)   [employer] of Occupational Health - Occupational Stress Questionnaire    Feeling of Stress : Rather much  Social Connections: Moderately Isolated (01/09/2024)   Social Connection and Isolation Panel [NHANES]    Frequency of Communication with Friends and Family: Three times a week    Frequency of Social Gatherings with Friends and Family: More than three times a week    Attends Religious Services: Never    Database Administrator or Organizations: No    Attends Engineer, Structural: More than 4 times per year    Marital Status: Never married  Intimate Partner Violence: Not At Risk (01/09/2024)   Humiliation, Afraid, Rape, and Kick questionnaire    Fear of Current or Ex-Partner: No    Emotionally Abused: No    Physically Abused: No    Sexually Abused: No     Current Outpatient Medications:  busPIRone  (BUSPAR ) 15 MG tablet, Take 1 tablet (15 mg total) by mouth 2 (two) times daily., Disp: 180 tablet, Rfl: 0   cariprazine  (VRAYLAR ) 1.5 MG capsule, TAKE 1 CAPSULE BY MOUTH DAILY., Disp: 90 capsule, Rfl: 1   DULoxetine  (CYMBALTA ) 60 MG capsule, TAKE 1 CAPSULE DAILY, Disp: 90 capsule,  Rfl: 3   hydrOXYzine  (ATARAX ) 25 MG tablet, Take 1 tablet (25 mg total) by mouth every 8 (eight) hours as needed., Disp: 90 tablet, Rfl: 3   loratadine  (CLARITIN ) 10 MG tablet, Take 1 tablet (10 mg total) by mouth daily., Disp: 30 tablet, Rfl: 11   TROKENDI  XR 50 MG CP24, TAKE 1 CAPSULE AT BEDTIME, Disp: 90 capsule, Rfl: 3   WEGOVY  0.25 MG/0.5ML SOAJ, Inject 0.25 mg into the skin once a week. Use this dose for 1 month (4 shots), Disp: 2 mL, Rfl: 0  No Known Allergies   ROS  Constitutional: Negative for fever or weight change.  Respiratory: Negative for cough and shortness of breath.   Cardiovascular: Negative for chest pain or palpitations.  Gastrointestinal: Negative for abdominal pain, no bowel changes.  Musculoskeletal: Negative for gait problem or joint swelling.  Skin: Negative for rash.  Neurological: Negative for dizziness or headache.  No other specific complaints in a complete review of systems (except as listed in HPI above).   Objective  Vitals:   01/09/24 1057  BP: 116/72  Pulse: (!) 109  Resp: 18  SpO2: 97%  Weight: 162 lb 1.6 oz (73.5 kg)  Height: 5' 4 (1.626 m)    Body mass index is 27.82 kg/m.  Physical Exam Vitals reviewed. Exam conducted with a chaperone present.  Constitutional:      Appearance: Normal appearance.  HENT:     Head: Normocephalic.     Right Ear: Tympanic membrane normal.     Left Ear: Tympanic membrane normal.     Nose: Nose normal.  Eyes:     Extraocular Movements: Extraocular movements intact.     Conjunctiva/sclera: Conjunctivae normal.     Pupils: Pupils are equal, round, and reactive to light.  Neck:     Thyroid: No thyroid mass, thyromegaly or thyroid tenderness.  Cardiovascular:     Rate and Rhythm: Normal rate and regular rhythm.     Pulses: Normal pulses.     Heart sounds: Normal heart sounds.  Pulmonary:     Effort: Pulmonary effort is normal.     Breath sounds: Normal breath sounds.  Chest:  Breasts:    Right:  Normal.     Left: Normal.  Abdominal:     General: Bowel sounds are normal.     Palpations: Abdomen is soft.  Genitourinary:    Vagina: Normal.     Cervix: Normal.     Uterus: Normal.      Adnexa: Right adnexa normal and left adnexa normal.  Musculoskeletal:        General: Normal range of motion.     Cervical back: Normal range of motion and neck supple.     Right lower leg: No edema.     Left lower leg: No edema.  Skin:    General: Skin is warm and dry.     Capillary Refill: Capillary refill takes less than 2 seconds.  Neurological:     General: No focal deficit present.     Mental Status: She is alert and oriented to person, place, and time. Mental status is at baseline.  Psychiatric:  Mood and Affect: Mood normal.        Behavior: Behavior normal.        Thought Content: Thought content normal.        Judgment: Judgment normal.      Fall Risk:    01/09/2024   10:58 AM 05/10/2023    1:28 PM 01/10/2023    1:15 PM 11/22/2022    1:35 PM 06/18/2022   12:57 PM  Fall Risk   Falls in the past year? 0 0 0 0 0  Number falls in past yr: 0 0 0 0 0  Injury with Fall? 0 0 0 0 0  Risk for fall due to :  No Fall Risks No Fall Risks    Follow up Falls evaluation completed Falls prevention discussed;Education provided;Falls evaluation completed Falls prevention discussed;Education provided;Falls evaluation completed  Falls evaluation completed     Functional Status Survey: Is the patient deaf or have difficulty hearing?: No Does the patient have difficulty seeing, even when wearing glasses/contacts?: No Does the patient have difficulty concentrating, remembering, or making decisions?: No Does the patient have difficulty walking or climbing stairs?: No Does the patient have difficulty dressing or bathing?: No Does the patient have difficulty doing errands alone such as visiting a doctor's office or shopping?: No   Assessment & Plan  Problem List Items Addressed This Visit        Other   Anxiety   Severe episode of recurrent major depressive disorder, without psychotic features (HCC)   Overweight (BMI 25.0-29.9)   Relevant Medications   WEGOVY  0.25 MG/0.5ML SOAJ   RESOLVED: Major depression, recurrent, chronic (HCC)   Other Visit Diagnoses       Annual physical exam    -  Primary   Relevant Orders   CBC with Differential/Platelet   Comprehensive metabolic panel with GFR   Hemoglobin A1c   Hepatitis C antibody   HIV Antibody (routine testing w rflx)   Lipid panel     Screening for deficiency anemia       Relevant Orders   CBC with Differential/Platelet     Screening for HIV without presence of risk factors       Relevant Orders   HIV Antibody (routine testing w rflx)     Screening for cholesterol level       Relevant Orders   Lipid panel     Screening for diabetes mellitus       Relevant Orders   Comprehensive metabolic panel with GFR   Hemoglobin A1c     Encounter for hepatitis C screening test for low risk patient       Relevant Orders   Hepatitis C antibody     Screening for cervical cancer       Relevant Orders   Cytology - PAP     Macromastia       Relevant Orders   Ambulatory referral to Plastic Surgery      Assessment and Plan Assessment & Plan Overweight BMI is 27.82, indicating overweight status. She expresses insecurity about her weight and reports weight gain despite a somewhat healthy diet and exercising 3-4 times weekly. She is interested in weight loss medication, specifically Wegovy , with no personal history of pancreatitis or family history of thyroid cancer. The delivery system of Wegovy  was discussed, and she was advised to discontinue if pregnancy occurs. Wegovy  is considered due to its potential to aid in weight loss, which may also help reduce breast size and associated back pain. -  Prescribe Wegovy  for weight loss - Advise to discontinue Wegovy  if pregnancy occurs - Monitor weight and breast size with Wegovy   treatment  Breast Hypertrophy Increased breast size associated with weight gain is causing significant back pain. She is considering breast reduction and is advised to try weight loss with Wegovy  first to see if it reduces breast size. - Monitor breast size and back pain with weight loss on Wegovy  - referral placed for  consultation for breast reduction if no improvement with weight loss  Depression and Anxiety Positive screenings for depression and anxiety. She is on Vraylar , Buspar , hydroxyzine , and Cymbalta . Reports high levels of depression and anxiety. She is in the process of finding a psychiatrist as recommended by her therapist, who has initiated an internal referral. - Refer to psychiatry for further management of depression and anxiety  General Health Maintenance Due for routine health maintenance appointments, including dental and eye exams. She has not been to the dentist in almost a year and plans to schedule an eye exam. - Schedule dental exam - Schedule eye exam    -USPSTF grade A and B recommendations reviewed with patient; age-appropriate recommendations, preventive care, screening tests, etc discussed and encouraged; healthy living encouraged; see AVS for patient education given to patient -Discussed importance of 150 minutes of physical activity weekly, eat two servings of fish weekly, eat one serving of tree nuts ( cashews, pistachios, pecans, almonds.SABRA) every other day, eat 6 servings of fruit/vegetables daily and drink plenty of water and avoid sweet beverages.   -Reviewed Health Maintenance: yes

## 2024-01-09 ENCOUNTER — Other Ambulatory Visit (HOSPITAL_COMMUNITY)
Admission: RE | Admit: 2024-01-09 | Discharge: 2024-01-09 | Disposition: A | Discharge status: Home / Self Care | Source: Ambulatory Visit | Attending: Nurse Practitioner | Admitting: Nurse Practitioner

## 2024-01-09 ENCOUNTER — Ambulatory Visit (INDEPENDENT_AMBULATORY_CARE_PROVIDER_SITE_OTHER): Admitting: Nurse Practitioner

## 2024-01-09 ENCOUNTER — Encounter: Payer: Self-pay | Admitting: Nurse Practitioner

## 2024-01-09 VITALS — BP 116/72 | HR 109 | Resp 18 | Ht 64.0 in | Wt 162.1 lb

## 2024-01-09 DIAGNOSIS — Z0000 Encounter for general adult medical examination without abnormal findings: Secondary | ICD-10-CM

## 2024-01-09 DIAGNOSIS — Z114 Encounter for screening for human immunodeficiency virus [HIV]: Secondary | ICD-10-CM

## 2024-01-09 DIAGNOSIS — Z124 Encounter for screening for malignant neoplasm of cervix: Secondary | ICD-10-CM | POA: Insufficient documentation

## 2024-01-09 DIAGNOSIS — Z131 Encounter for screening for diabetes mellitus: Secondary | ICD-10-CM

## 2024-01-09 DIAGNOSIS — Z130 Encounter for screening for diseases of the blood and blood-forming organs and certain disorders involving the immune mechanism: Secondary | ICD-10-CM | POA: Diagnosis not present

## 2024-01-09 DIAGNOSIS — E663 Overweight: Secondary | ICD-10-CM | POA: Insufficient documentation

## 2024-01-09 DIAGNOSIS — F419 Anxiety disorder, unspecified: Secondary | ICD-10-CM

## 2024-01-09 DIAGNOSIS — Z13220 Encounter for screening for lipoid disorders: Secondary | ICD-10-CM

## 2024-01-09 DIAGNOSIS — N62 Hypertrophy of breast: Secondary | ICD-10-CM

## 2024-01-09 DIAGNOSIS — F339 Major depressive disorder, recurrent, unspecified: Secondary | ICD-10-CM

## 2024-01-09 DIAGNOSIS — Z1159 Encounter for screening for other viral diseases: Secondary | ICD-10-CM

## 2024-01-09 DIAGNOSIS — F332 Major depressive disorder, recurrent severe without psychotic features: Secondary | ICD-10-CM

## 2024-01-09 MED ORDER — WEGOVY 0.25 MG/0.5ML ~~LOC~~ SOAJ: 0.2500 mg | SUBCUTANEOUS | 0 refills | Status: DC

## 2024-01-10 ENCOUNTER — Encounter: Payer: Self-pay | Admitting: Nurse Practitioner

## 2024-01-10 ENCOUNTER — Telehealth: Payer: Self-pay

## 2024-01-10 ENCOUNTER — Other Ambulatory Visit: Payer: Self-pay | Admitting: Nurse Practitioner

## 2024-01-10 DIAGNOSIS — E663 Overweight: Secondary | ICD-10-CM

## 2024-01-10 LAB — COMPREHENSIVE METABOLIC PANEL WITH GFR
AG Ratio: 2 (calc) (ref 1.0–2.5)
ALT: 13 U/L (ref 6–29)
AST: 15 U/L (ref 10–30)
Albumin: 4.4 g/dL (ref 3.6–5.1)
Alkaline phosphatase (APISO): 140 U/L — ABNORMAL HIGH (ref 31–125)
BUN: 10 mg/dL (ref 7–25)
CO2: 23 mmol/L (ref 20–32)
Calcium: 9.3 mg/dL (ref 8.6–10.2)
Chloride: 107 mmol/L (ref 98–110)
Creat: 0.69 mg/dL (ref 0.50–0.96)
Globulin: 2.2 g/dL (ref 1.9–3.7)
Glucose, Bld: 98 mg/dL (ref 65–139)
Potassium: 4.5 mmol/L (ref 3.5–5.3)
Sodium: 141 mmol/L (ref 135–146)
Total Bilirubin: 0.5 mg/dL (ref 0.2–1.2)
Total Protein: 6.6 g/dL (ref 6.1–8.1)
eGFR: 127 mL/min/{1.73_m2} (ref >=60)

## 2024-01-10 LAB — HEMOGLOBIN A1C
Hgb A1c MFr Bld: 5.1 %{Hb} (ref <5.7)
Mean Plasma Glucose: 100 mg/dL
eAG (mmol/L): 5.5 mmol/L

## 2024-01-10 LAB — CBC WITH DIFFERENTIAL/PLATELET
Absolute Lymphocytes: 2346 {cells}/uL (ref 850–3900)
Absolute Monocytes: 517 {cells}/uL (ref 200–950)
Basophils Absolute: 27 {cells}/uL (ref 0–200)
Basophils Relative: 0.4 %
Eosinophils Absolute: 102 {cells}/uL (ref 15–500)
Eosinophils Relative: 1.5 %
HCT: 41 % (ref 35.0–45.0)
Hemoglobin: 13.3 g/dL (ref 11.7–15.5)
MCH: 26.7 pg — ABNORMAL LOW (ref 27.0–33.0)
MCHC: 32.4 g/dL (ref 32.0–36.0)
MCV: 82.3 fL (ref 80.0–100.0)
MPV: 10.6 fL (ref 7.5–12.5)
Monocytes Relative: 7.6 %
Neutro Abs: 3808 {cells}/uL (ref 1500–7800)
Neutrophils Relative %: 56 %
Platelets: 265 10*3/uL (ref 140–400)
RBC: 4.98 10*6/uL (ref 3.80–5.10)
RDW: 14.9 % (ref 11.0–15.0)
Total Lymphocyte: 34.5 %
WBC: 6.8 10*3/uL (ref 3.8–10.8)

## 2024-01-10 LAB — LIPID PANEL
Cholesterol: 184 mg/dL (ref <200)
HDL: 41 mg/dL — ABNORMAL LOW (ref >=50)
LDL Cholesterol (Calc): 115 mg/dL — ABNORMAL HIGH
Non-HDL Cholesterol (Calc): 143 mg/dL — ABNORMAL HIGH (ref <130)
Total CHOL/HDL Ratio: 4.5 (calc) (ref <5.0)
Triglycerides: 168 mg/dL — ABNORMAL HIGH (ref <150)

## 2024-01-10 LAB — HIV ANTIBODY (ROUTINE TESTING W REFLEX): HIV 1&2 Ab, 4th Generation: NONREACTIVE

## 2024-01-10 LAB — HEPATITIS C ANTIBODY: Hepatitis C Ab: NONREACTIVE

## 2024-01-10 NOTE — Telephone Encounter (Signed)
 Prior auth for   WEGOVY 0.25 MG/0.5ML SOAJ    Key [id number]

## 2024-01-10 NOTE — Telephone Encounter (Signed)
 PA submitted.

## 2024-01-10 NOTE — Telephone Encounter (Signed)
 Prior Auth for    WEGOVY 0.25 MG/0.5ML SOAJ    Key

## 2024-01-13 LAB — CYTOLOGY - PAP
Adequacy: ABSENT
Diagnosis: NEGATIVE

## 2024-01-13 NOTE — Telephone Encounter (Signed)
 Requested medications are due for refill today.  See pharmacy note  Requested medications are on the active medications list.  yes  Last refill. 01/09/2024  Future visit scheduled.   yes  Notes to clinic.  Pharmacy comment: Alternative Requested:PA DENIED?    Requested Prescriptions  Pending Prescriptions Disp Refills   WEGOVY 0.25 MG/0.5ML SOAJ [Pharmacy Med Name: WEGOVY 0.25 MG/0.5 ML PEN]  0    Sig: Inject 0.25 mg into the skin once a week. Use this dose for 1 month (4 shots)     Endocrinology:  Diabetes - GLP-1 Receptor Agonists - semaglutide Failed - 01/13/2024  9:21 AM      Failed - Valid encounter within last 6 months    Recent Outpatient Visits           4 days ago Annual physical exam   [facility] Quinton Buckler, FNP       Future Appointments             In 12 months Adeline Hone, PA-C [facility]            Passed - HBA1C in normal range and within 180 days    Hgb A1c MFr Bld  Date Value Ref Range Status  01/09/2024 5.1 <5.7 % of total Hgb Final    Comment:    For the purpose of screening for the presence of diabetes: . <5.7%       Consistent with the absence of diabetes 5.7-6.4%    Consistent with increased risk for diabetes             (prediabetes) > or =6.5%  Consistent with diabetes . This assay result is consistent with a decreased risk of diabetes. . Currently, no consensus exists regarding use of hemoglobin A1c for diagnosis of diabetes in children. . According to American Diabetes Association (ADA) guidelines, hemoglobin A1c <7.0% represents optimal control in non-pregnant diabetic patients. Different metrics may apply to specific patient populations.  Standards of Medical Care in Diabetes(ADA). .          Passed - Cr in normal range and within 360 days    Creat  Date Value Ref Range Status  01/09/2024 0.69 0.50 - 0.96 mg/dL Final

## 2024-01-14 ENCOUNTER — Encounter: Payer: Self-pay | Admitting: Internal Medicine

## 2024-02-12 ENCOUNTER — Encounter: Payer: Self-pay | Admitting: Nurse Practitioner

## 2024-02-12 ENCOUNTER — Other Ambulatory Visit (HOSPITAL_COMMUNITY): Payer: Self-pay

## 2024-02-12 ENCOUNTER — Telehealth: Payer: Self-pay | Admitting: Pharmacy Technician

## 2024-02-12 NOTE — Telephone Encounter (Signed)
 PA request has been Submitted. New Encounter has been or will be created for follow up. For additional info see Pharmacy Prior Auth telephone encounter from 02/12/24.

## 2024-02-12 NOTE — Telephone Encounter (Signed)
 Pharmacy Patient Advocate Encounter   Received notification from Patient Advice Request messages that prior authorization for WEGOVY  0.25 MG is required/requested.   Insurance verification completed.   The patient is insured through [facility] .   Per test claim: PA required; PA submitted to above mentioned insurance via CoverMyMeds Key/confirmation #/EOC [id number] Status is pending   This medication was denied by the plan last month but I resubmitted a new PA with additional info about elevated lipids, hopefully this will help get an approval.

## 2024-02-13 ENCOUNTER — Other Ambulatory Visit (HOSPITAL_COMMUNITY): Payer: Self-pay

## 2024-02-13 NOTE — Telephone Encounter (Signed)
 Pharmacy Patient Advocate Encounter  Received notification from [facility] that Prior Authorization for Wegovy  0.25MG /0.5ML auto-injectors has been APPROVED from 02/12/24 to 09/13/24. Ran test claim, Copay is $24.99. This test claim was processed through [facility]- copay amounts may vary at other pharmacies due to pharmacy/plan contracts, or as the patient moves through the different stages of their insurance plan.   PA #/Case ID/Reference #: [id number]

## 2024-03-09 ENCOUNTER — Other Ambulatory Visit: Payer: Self-pay | Admitting: Nurse Practitioner

## 2024-03-09 DIAGNOSIS — E663 Overweight: Secondary | ICD-10-CM

## 2024-03-10 NOTE — Telephone Encounter (Signed)
Called and lvm inquiring

## 2024-03-10 NOTE — Telephone Encounter (Signed)
 Requested medication (s) are due for refill today: yes   Requested medication (s) are on the active medication list: yes   Last refill:  01/09/24 # 2ml 0 refills  Future visit scheduled: yes in 10 months  Notes to clinic:  please advise if dose increase needed.  Do you want to refill Rx?     Requested Prescriptions  Pending Prescriptions Disp Refills   WEGOVY  0.25 MG/0.5ML SOAJ [Pharmacy Med Name: WEGOVY  0.25 MG/0.5 ML PEN]      Sig: Inject 0.25 mg into the skin once a week. Use this dose for 1 month (4 shots)     Endocrinology:  Diabetes - GLP-1 Receptor Agonists - semaglutide  Failed - 03/10/2024  2:35 PM      Failed - Valid encounter within last 6 months    Recent Outpatient Visits           2 months ago Annual physical exam   [facility] Quinton Buckler, FNP       Future Appointments             In 10 months Adeline Hone, PA-C [facility]            Passed - HBA1C in normal range and within 180 days    Hgb A1c MFr Bld  Date Value Ref Range Status  01/09/2024 5.1 <5.7 % of total Hgb Final    Comment:    For the purpose of screening for the presence of diabetes: . <5.7%       Consistent with the absence of diabetes 5.7-6.4%    Consistent with increased risk for diabetes             (prediabetes) > or =6.5%  Consistent with diabetes . This assay result is consistent with a decreased risk of diabetes. . Currently, no consensus exists regarding use of hemoglobin A1c for diagnosis of diabetes in children. . According to American Diabetes Association (ADA) guidelines, hemoglobin A1c <7.0% represents optimal control in non-pregnant diabetic patients. Different metrics may apply to specific patient populations.  Standards of Medical Care in Diabetes(ADA). .          Passed - Cr in normal range and within 360 days    Creat  Date Value Ref Range Status  01/09/2024 0.69 0.50 - 0.96 mg/dL Final

## 2024-03-10 NOTE — Telephone Encounter (Signed)
 I didn't start this medicine - julie did - so in order for me to do the next refill I need some info from the pt Can you please call her?  It looks like she started the initial dose and only got the first 4 pens. Can you ask and document when her last dose was and how she tolerated SE and if she wants to do the next dose increase.  In order for me to take it over and manage I will need to see her for f/up in office in the next ~8 weeks or so   thanks

## 2024-03-11 NOTE — Telephone Encounter (Signed)
 Called w/no answer. Have not heard back from patient.

## 2024-03-16 ENCOUNTER — Encounter: Payer: Self-pay | Admitting: Nurse Practitioner

## 2024-04-06 ENCOUNTER — Ambulatory Visit: Admitting: Family Medicine

## 2024-05-19 ENCOUNTER — Ambulatory Visit: Admitting: Family Medicine

## 2024-05-26 ENCOUNTER — Ambulatory Visit: Admitting: Family Medicine

## 2024-06-02 ENCOUNTER — Ambulatory Visit: Admitting: Family Medicine

## 2024-06-08 ENCOUNTER — Other Ambulatory Visit: Payer: Self-pay | Admitting: Family Medicine

## 2024-06-08 ENCOUNTER — Ambulatory Visit: Admitting: Family Medicine

## 2024-06-08 ENCOUNTER — Encounter: Payer: Self-pay | Admitting: Family Medicine

## 2024-06-08 VITALS — BP 120/68 | HR 86 | Resp 16 | Ht 64.0 in | Wt 159.0 lb

## 2024-06-08 DIAGNOSIS — F419 Anxiety disorder, unspecified: Secondary | ICD-10-CM | POA: Diagnosis not present

## 2024-06-08 DIAGNOSIS — G43009 Migraine without aura, not intractable, without status migrainosus: Secondary | ICD-10-CM | POA: Diagnosis not present

## 2024-06-08 DIAGNOSIS — F339 Major depressive disorder, recurrent, unspecified: Secondary | ICD-10-CM

## 2024-06-08 DIAGNOSIS — F5104 Psychophysiologic insomnia: Secondary | ICD-10-CM

## 2024-06-08 DIAGNOSIS — E663 Overweight: Secondary | ICD-10-CM

## 2024-06-08 MED ORDER — TROKENDI XR 50 MG PO CP24: ORAL_CAPSULE | ORAL | 3 refills | Status: DC

## 2024-06-08 MED ORDER — ZEPBOUND 2.5 MG/0.5ML ~~LOC~~ SOAJ: 2.5000 mg | SUBCUTANEOUS | 1 refills | Status: DC

## 2024-06-08 NOTE — Assessment & Plan Note (Signed)
 Wt Readings from Last 5 Encounters:  06/08/24 159 lb (72.1 kg)  01/09/24 162 lb 1.6 oz (73.5 kg)  05/10/23 153 lb (69.4 kg)  01/10/23 131 lb 3.2 oz (59.5 kg)  12/12/22 120 lb (54.4 kg)   BMI Readings from Last 5 Encounters:  06/08/24 27.29 kg/m  01/09/24 27.82 kg/m  05/10/23 26.26 kg/m  01/10/23 22.52 kg/m  12/12/22 20.60 kg/m     Expand All Collapse All Name: Tanya Higgins   MRN: [id number]    DOB: Jan 27, 2001   Date:01/09/2024                                                               Progress Note   Subjective   Chief Complaint      Chief Complaint  Patient presents with   Annual Exam   Obesity      HPI   Patient presents for annual CPE and discuss weight loss medication   Discussed the use of AI scribe software for clinical note transcription with the patient, who gave verbal consent to proceed.   History of Present Illness Tanya Higgins is a 23 year old female who presents for a physical and to discuss weight loss medication.   She is experiencing weight gain despite eating somewhat healthily and exercising three to four times a week. The weight gain is primarily in her breasts and thighs, leading to back pain. She identifies stress and emotional eating as contributing factors.   She has a history of depression and anxiety, with positive screenings for both conditions. She is currently taking Vraylar  1.5 mg daily, Buspar  15 mg twice daily, hydroxyzine  25 mg every eight hours, and Cymbalta  60 mg daily. Her anxiety and depression levels are very high today, and she is in the process of finding a psychiatrist, as her therapist is fully booked. She describes herself as extremely depressed.   In terms of lifestyle, she does not consume vegetables but eats other food groups. She reports difficulty sleeping nightly but manages to get eight to nine hours of sleep. She has not been to the dentist in almost a year and is due for an eye exam. She is not currently  sexually active and has no history of pancreatitis or family history of thyroid cancer. Her last menstrual cycle started on March 10th, and she is expecting her next cycle to start tomorrow.     Diet: does not eat  veggies, but eats all the other food groups Exercise: 3-4 days a week. recommend 150 min of physical activity weekly    Sleep: 8 - 9 hours Last dental exam:about a year ago Last eye exam: due   Flowsheet Row Office Visit from 01/09/2024 in [facility]  AUDIT-C Score 1          Depression: Phq 9 is  positive, currently on medication vraylar  1.5 mg daily, buspar  15 mg BID, hydroxyzine  25 mg every 8 hours, Cymbalta  60 mg daily.  Recommend patient get established with psychiatry.      01/09/2024   11:04 AM 05/10/2023    1:28 PM 01/10/2023    1:16 PM 11/22/2022    1:35 PM 10/08/2022    1:30 PM  Depression screen PHQ 2/9  Decreased Interest 3 1 0 0  2  Down, Depressed, Hopeless 2 1 0 0 3  PHQ - 2 Score 5 2 0 0 5  Altered sleeping 3 1 0   1  Tired, decreased energy 3 1 0   2  Change in appetite 3 1 0   3  Feeling bad or failure about yourself  3 1 0   3  Trouble concentrating 1 0 0   2  Moving slowly or fidgety/restless 1 0 0   3  Suicidal thoughts 2 0 0   2  PHQ-9 Score 21 6 0   21  Difficult doing work/chores Somewhat difficult Very difficult Not difficult at all   Very difficult        01/09/2024   11:04 AM 05/10/2023    1:28 PM 10/08/2022    1:31 PM 01/24/2022    2:03 PM  GAD 7 : Generalized Anxiety Score  Nervous, Anxious, on Edge 1 2 3 1   Control/stop worrying 1 2 0 0  Worry too much - different things 1 2 2  0  Trouble relaxing 3 2 1 1   Restless 3 2 1 3   Easily annoyed or irritable 2 2 3  0  Afraid - awful might happen 1 2 2 1   Total GAD 7 Score 12 14 12 6   Anxiety Difficulty Somewhat difficult Very difficult Somewhat difficult Not difficult at all      Hypertension:    BP Readings from Last 3 Encounters:  01/09/24 116/72  05/10/23 114/72   01/10/23 112/68    Obesity:    Wt Readings from Last 3 Encounters:  01/09/24 162 lb 1.6 oz (73.5 kg)  05/10/23 153 lb (69.4 kg)  01/10/23 131 lb 3.2 oz (59.5 kg)       BMI Readings from Last 3 Encounters:  01/09/24 27.82 kg/m  05/10/23 26.26 kg/m  01/10/23 22.52 kg/m      Vaccines:  HPV: up to at age 26 , ask insurance if age between 76-45  Shingrix: 15-64 yo and ask insurance if covered when patient above 33 yo Pneumonia:  educated and discussed with patient. Flu:  educated and discussed with patient.   Hep C Screening: due STD testing and prevention (HIV/chl/gon/syphilis): due Intimate partner violence:none Sexual History : not currently sexually active Menstrual History/LMP/Abnormal Bleeding: [facility]: 12/09/2023 Incontinence Symptoms: none   Breast cancer:  - Last Mammogram: does not qualify - BRCA gene screening: none   Osteoporosis: Discussed high calcium and vitamin D supplementation, weight bearing exercises   Cervical cancer screening: due   Skin cancer: Discussed monitoring for atypical lesions  Colorectal cancer: does not qualify   Lung cancer:   Low Dose CT Chest recommended if Age 21-80 years, 20 pack-year currently smoking OR have quit w/in 15years. Patient does not qualify.   ECG: none   Advanced Care Planning: A voluntary discussion about advance care planning including the explanation and discussion of advance directives.  Discussed health care proxy and Living will, and the patient was able to identify a health care proxy as mom.  Patient does not have a living will at present time. If patient does have living will, I have requested they bring this to the clinic to be scanned in to their chart.   Lipids: Recent Labs       Lab Results  Component Value Date    CHOL 140 08/01/2018      Recent Labs  No results found for: HDL   Recent Labs  No results found for: [facility]  Recent Labs  No results found for: TRIG   Recent Labs  No results  found for: CHOLHDL   Recent Labs  No results found for: LDLDIRECT     Glucose: Last Labs         Glucose, Bld  Date Value Ref Range Status  01/10/2022 87 65 - 99 mg/dL Final      Comment:      .            Fasting reference interval .    10/30/2020 113 (H) 70 - 99 mg/dL Final      Comment:      Glucose reference range applies only to samples taken after fasting for at least 8 hours.  01/10/2020 153 (H) 70 - 99 mg/dL Final      Comment:      Glucose reference range applies only to samples taken after fasting for at least 8 hours.            Patient Active Problem List    Diagnosis Date Noted   Overweight (BMI 25.0-29.9) 01/09/2024   Severe episode of recurrent major depressive disorder, without psychotic features (HCC) 10/08/2022   Blood in stool 01/10/2022   Hallucinations 06/19/2019   Loss of weight 06/19/2019   Panic 02/10/2018   Tension headache 08/16/2017   Migraine without aura and without status migrainosus, not intractable 08/14/2017   Allergic rhinitis, seasonal 06/17/2015   Anxiety 05/07/2015   Picky eater 05/07/2015   Isolated phobia 05/07/2015           Past Surgical History:  Procedure Laterality Date   FRACTURE SURGERY                   Family History  Problem Relation Age of Onset   Depression Mother     Hypertension Mother     Asthma Mother     Migraines Mother     Diabetes Mother     Alcohol abuse Father     Diabetes Sister            Social History         Socioeconomic History   Marital status: Single      Spouse name: Not on file   Number of children: Not on file   Years of education: Not on file   Highest education level: Some college, no degree  Occupational History   Occupation: Consulting civil engineer   Tobacco Use   Smoking status: Passive Smoke Exposure - Never Smoker   Smokeless tobacco: Never   Tobacco comments:      i havent smoked a cigarette in months, didnt like it  Vaping Use   Vaping status: Never Used  Substance  and Sexual Activity   Alcohol use: No   Drug use: No   Sexual activity: Not Currently      Birth control/protection: None  Other Topics Concern   Not on file  Social History Narrative    Tanya Higgins is a 12 th grade student.    She attends [employer], but virtually since 10/2018     She lives with both parents. She has one sister.    Social Drivers of Health        Financial Resource Strain: Patient Declined (01/07/2024)    Overall Financial Resource Strain (CARDIA)     Difficulty of Paying Living Expenses: Patient declined  Food Insecurity: No Food Insecurity (01/09/2024)    Hunger Vital Sign     Worried About Running Out  of Food in the Last Year: Never true     Ran Out of Food in the Last Year: Never true  Recent Concern: Food Insecurity - Food Insecurity Present (01/07/2024)    Hunger Vital Sign     Worried About Running Out of Food in the Last Year: Sometimes true     Ran Out of Food in the Last Year: Sometimes true  Transportation Needs: No Transportation Needs (01/09/2024)    PRAPARE - Therapist, art (Medical): No     Lack of Transportation (Non-Medical): No  Recent Concern: Transportation Needs - Unmet Transportation Needs (01/07/2024)    PRAPARE - Transportation     Lack of Transportation (Medical): No     Lack of Transportation (Non-Medical): Yes  Physical Activity: Inactive (01/09/2024)    Exercise Vital Sign     Days of Exercise per Week: 0 days     Minutes of Exercise per Session: 0 min  Stress: Stress Concern Present (01/07/2024)    [employer] of Occupational Health - Occupational Stress Questionnaire     Feeling of Stress : Rather much  Social Connections: Moderately Isolated (01/09/2024)    Social Connection and Isolation Panel [NHANES]     Frequency of Communication with Friends and Family: Three times a week     Frequency of Social Gatherings with Friends and Family: More than three times a week     Attends Religious Services:  Never     Database administrator or Organizations: No     Attends Engineer, structural: More than 4 times per year     Marital Status: Never married  Intimate Partner Violence: Not At Risk (01/09/2024)    Humiliation, Afraid, Rape, and Kick questionnaire     Fear of Current or Ex-Partner: No     Emotionally Abused: No     Physically Abused: No     Sexually Abused: No       Current Medications    Current Outpatient Medications:    busPIRone  (BUSPAR ) 15 MG tablet, Take 1 tablet (15 mg total) by mouth 2 (two) times daily., Disp: 180 tablet, Rfl: 0   cariprazine  (VRAYLAR ) 1.5 MG capsule, TAKE 1 CAPSULE BY MOUTH DAILY., Disp: 90 capsule, Rfl: 1   DULoxetine  (CYMBALTA ) 60 MG capsule, TAKE 1 CAPSULE DAILY, Disp: 90 capsule, Rfl: 3   hydrOXYzine  (ATARAX ) 25 MG tablet, Take 1 tablet (25 mg total) by mouth every 8 (eight) hours as needed., Disp: 90 tablet, Rfl: 3   loratadine  (CLARITIN ) 10 MG tablet, Take 1 tablet (10 mg total) by mouth daily., Disp: 30 tablet, Rfl: 11   TROKENDI  XR 50 MG CP24, TAKE 1 CAPSULE AT BEDTIME, Disp: 90 capsule, Rfl: 3   WEGOVY  0.25 MG/0.5ML SOAJ, Inject 0.25 mg into the skin once a week. Use this dose for 1 month (4 shots), Disp: 2 mL, Rfl: 0     Allergies  No Known Allergies       ROS   Constitutional: Negative for fever or weight change.  Respiratory: Negative for cough and shortness of breath.   Cardiovascular: Negative for chest pain or palpitations.  Gastrointestinal: Negative for abdominal pain, no bowel changes.  Musculoskeletal: Negative for gait problem or joint swelling.  Skin: Negative for rash.  Neurological: Negative for dizziness or headache.  No other specific complaints in a complete review of systems (except as listed in HPI above).    Objective      Vitals:  01/09/24 1057  BP: 116/72  Pulse: (!) 109  Resp: 18  SpO2: 97%  Weight: 162 lb 1.6 oz (73.5 kg)  Height: 5' 4 (1.626 m)      Body mass index is 27.82 kg/m.    Physical Exam Vitals reviewed. Exam conducted with a chaperone present.  Constitutional:      Appearance: Normal appearance.  HENT:     Head: Normocephalic.     Right Ear: Tympanic membrane normal.     Left Ear: Tympanic membrane normal.     Nose: Nose normal.  Eyes:     Extraocular Movements: Extraocular movements intact.     Conjunctiva/sclera: Conjunctivae normal.     Pupils: Pupils are equal, round, and reactive to light.  Neck:     Thyroid: No thyroid mass, thyromegaly or thyroid tenderness.  Cardiovascular:     Rate and Rhythm: Normal rate and regular rhythm.     Pulses: Normal pulses.     Heart sounds: Normal heart sounds.  Pulmonary:     Effort: Pulmonary effort is normal.     Breath sounds: Normal breath sounds.  Chest:  Breasts:    Right: Normal.     Left: Normal.  Abdominal:     General: Bowel sounds are normal.     Palpations: Abdomen is soft.  Genitourinary:    Vagina: Normal.     Cervix: Normal.     Uterus: Normal.      Adnexa: Right adnexa normal and left adnexa normal.  Musculoskeletal:        General: Normal range of motion.     Cervical back: Normal range of motion and neck supple.     Right lower leg: No edema.     Left lower leg: No edema.  Skin:    General: Skin is warm and dry.     Capillary Refill: Capillary refill takes less than 2 seconds.  Neurological:     General: No focal deficit present.     Mental Status: She is alert and oriented to person, place, and time. Mental status is at baseline.  Psychiatric:        Mood and Affect: Mood normal.        Behavior: Behavior normal.        Thought Content: Thought content normal.        Judgment: Judgment normal.          Fall Risk:     01/09/2024   10:58 AM 05/10/2023    1:28 PM 01/10/2023    1:15 PM 11/22/2022    1:35 PM 06/18/2022   12:57 PM  Fall Risk   Falls in the past year? 0 0 0 0 0  Number falls in past yr: 0 0 0 0 0  Injury with Fall? 0 0 0 0 0  Risk for fall due to :   No  Fall Risks No Fall Risks      Follow up Falls evaluation completed Falls prevention discussed;Education provided;Falls evaluation completed Falls prevention discussed;Education provided;Falls evaluation completed   Falls evaluation completed        Functional Status Survey: Is the patient deaf or have difficulty hearing?: No Does the patient have difficulty seeing, even when wearing glasses/contacts?: No Does the patient have difficulty concentrating, remembering, or making decisions?: No Does the patient have difficulty walking or climbing stairs?: No Does the patient have difficulty dressing or bathing?: No Does the patient have difficulty doing errands alone such as visiting a doctor's office or  shopping?: No     Assessment & Plan   Problem List Items Addressed This Visit              Other    Anxiety    Severe episode of recurrent major depressive disorder, without psychotic features (HCC)    Overweight (BMI 25.0-29.9)    Relevant Medications    WEGOVY  0.25 MG/0.5ML SOAJ    RESOLVED: Major depression, recurrent, chronic (HCC)    Other Visit Diagnoses         Annual physical exam    -  Primary    Relevant Orders    CBC with Differential/Platelet    Comprehensive metabolic panel with GFR    Hemoglobin A1c    Hepatitis C antibody    HIV Antibody (routine testing w rflx)    Lipid panel      Screening for deficiency anemia        Relevant Orders    CBC with Differential/Platelet      Screening for HIV without presence of risk factors        Relevant Orders    HIV Antibody (routine testing w rflx)      Screening for cholesterol level        Relevant Orders    Lipid panel      Screening for diabetes mellitus        Relevant Orders    Comprehensive metabolic panel with GFR    Hemoglobin A1c      Encounter for hepatitis C screening test for low risk patient        Relevant Orders    Hepatitis C antibody      Screening for cervical cancer        Relevant Orders     Cytology - PAP      Macromastia        Relevant Orders    Ambulatory referral to Plastic Surgery        Assessment and Plan Assessment & Plan Overweight BMI is 27.82, indicating overweight status. She expresses insecurity about her weight and reports weight gain despite a somewhat healthy diet and exercising 3-4 times weekly. She is interested in weight loss medication, specifically Wegovy , with no personal history of pancreatitis or family history of thyroid cancer. The delivery system of Wegovy  was discussed, and she was advised to discontinue if pregnancy occurs. Wegovy  is considered due to its potential to aid in weight loss, which may also help reduce breast size and associated back pain. Pt wishes to try and get zepbound  approved since she did not tolerate wegovy   Discussed dosing and SE and pt instructed not to do 2nd or subsequent doses if experiencing N/V daily  Continue to work on diet/nutrition, exercise and pt will send in details about current efforts

## 2024-06-08 NOTE — Progress Notes (Signed)
 Name: Tanya Higgins   MRN: [id number]    DOB: 04/10/2001   Date:06/08/2024       Progress Note  Chief Complaint  Patient presents with   Medical Management of Chronic Issues   Depression   Anxiety     Subjective:   Tanya Higgins is a 23 y.o. female, presents to clinic for routine follow up on chronic conditions  Here for f/up on meds for anxiety/depression mood disorder - now managed by psychiatry - meds have changed  Migraine/HA needs refills on trokendi    Tried wegovy  after getting it finally approved with her CPE and Mliss pender She states she took the 0.25 mg dose x 5 weeks and had N/V every single day She wants to try zepbound .  See CPE from April with overweight/obesity diet/lifestyle efforts - continued efforts Wt Readings from Last 5 Encounters:  06/08/24 159 lb (72.1 kg)  01/09/24 162 lb 1.6 oz (73.5 kg)  05/10/23 153 lb (69.4 kg)  01/10/23 131 lb 3.2 oz (59.5 kg)  12/12/22 120 lb (54.4 kg)   BMI Readings from Last 5 Encounters:  06/08/24 27.29 kg/m  01/09/24 27.82 kg/m  05/10/23 26.26 kg/m  01/10/23 22.52 kg/m  12/12/22 20.60 kg/m      Current Outpatient Medications:    busPIRone  (BUSPAR ) 15 MG tablet, Take 1 tablet (15 mg total) by mouth 2 (two) times daily., Disp: 180 tablet, Rfl: 0   cariprazine  (VRAYLAR ) 1.5 MG capsule, TAKE 1 CAPSULE BY MOUTH DAILY., Disp: 90 capsule, Rfl: 1   DULoxetine  (CYMBALTA ) 60 MG capsule, TAKE 1 CAPSULE DAILY, Disp: 90 capsule, Rfl: 3   hydrOXYzine  (ATARAX ) 25 MG tablet, Take 1 tablet (25 mg total) by mouth every 8 (eight) hours as needed., Disp: 90 tablet, Rfl: 3   loratadine  (CLARITIN ) 10 MG tablet, Take 1 tablet (10 mg total) by mouth daily., Disp: 30 tablet, Rfl: 11   TROKENDI  XR 50 MG CP24, TAKE 1 CAPSULE AT BEDTIME, Disp: 90 capsule, Rfl: 3   WEGOVY  0.25 MG/0.5ML SOAJ, INJECT 0.25 MG INTO THE SKIN ONCE A WEEK. USE THIS DOSE FOR 1 MONTH (4 SHOTS), Disp: 2 mL, Rfl: 0  Patient Active Problem List   Diagnosis  Date Noted   Overweight (BMI 25.0-29.9) 01/09/2024   Severe episode of recurrent major depressive disorder, without psychotic features (HCC) 10/08/2022   Blood in stool 01/10/2022   Hallucinations 06/19/2019   Loss of weight 06/19/2019   Panic 02/10/2018   Tension headache 08/16/2017   Migraine without aura and without status migrainosus, not intractable 08/14/2017   Allergic rhinitis, seasonal 06/17/2015   Anxiety 05/07/2015   Picky eater 05/07/2015   Isolated phobia 05/07/2015    Past Surgical History:  Procedure Laterality Date   FRACTURE SURGERY      Family History  Problem Relation Age of Onset   Depression Mother    Hypertension Mother    Asthma Mother    Migraines Mother    Diabetes Mother    Alcohol abuse Father    Diabetes Sister     Social History   Tobacco Use   Smoking status: Passive Smoke Exposure - Never Smoker   Smokeless tobacco: Never   Tobacco comments:    i havent smoked a cigarette in months, didnt like it  Vaping Use   Vaping status: Never Used  Substance Use Topics   Alcohol use: No   Drug use: No     No Known Allergies  Health Maintenance  Topic Date Due  Influenza Vaccine  05/01/2024   COVID-19 Vaccine (3 - 2025-26 season) 06/24/2024 (Originally 06/01/2024)   HPV VACCINES (1 - 3-dose series) 06/08/2025 (Originally 05/06/2017)   Meningococcal B Vaccine (1 of 2 - Standard) 06/08/2025 (Originally 05/06/2018)   Cervical Cancer Screening (Pap smear)  01/09/2027   DTaP/Tdap/Td (8 - Td or Tdap) 11/16/2030   Pneumococcal Vaccine  Completed   Hepatitis B Vaccines 19-59 Average Risk  Completed   Hepatitis C Screening  Completed   HIV Screening  Completed    Chart Review Today: I personally reviewed active problem list, medication list, allergies, family history, social history, health maintenance, notes from last encounter, lab results, imaging with the patient/caregiver today.   Review of Systems  Constitutional: Negative.   HENT:  Negative.    Eyes: Negative.   Respiratory: Negative.    Cardiovascular: Negative.   Gastrointestinal: Negative.   Endocrine: Negative.   Genitourinary: Negative.   Musculoskeletal: Negative.   Skin: Negative.   Allergic/Immunologic: Negative.   Neurological: Negative.   Hematological: Negative.   Psychiatric/Behavioral: Negative.    All other systems reviewed and are negative.    Objective:   There were no vitals filed for this visit.  There is no height or weight on file to calculate BMI.  Physical Exam Vitals and nursing note reviewed.  Constitutional:      General: She is not in acute distress.    Appearance: Normal appearance. She is well-developed, well-groomed and overweight. She is not ill-appearing, toxic-appearing or diaphoretic.  HENT:     Head: Normocephalic and atraumatic.     Right Ear: External ear normal.     Left Ear: External ear normal.     Nose: Nose normal.  Eyes:     General: No scleral icterus.       Right eye: No discharge.        Left eye: No discharge.     Conjunctiva/sclera: Conjunctivae normal.  Neck:     Trachea: No tracheal deviation.  Cardiovascular:     Rate and Rhythm: Normal rate.  Pulmonary:     Effort: Pulmonary effort is normal. No respiratory distress.     Breath sounds: No stridor.  Skin:    General: Skin is warm and dry.     Findings: No rash.  Neurological:     Mental Status: She is alert.     Motor: No abnormal muscle tone.     Coordination: Coordination normal.     Gait: Gait normal.  Psychiatric:        Mood and Affect: Mood normal.        Behavior: Behavior normal. Behavior is cooperative.      Functional Status Survey:   Results for orders placed or performed in visit on 01/09/24  Cytology - PAP   Collection Time: 01/09/24 11:20 AM  Result Value Ref Range   Adequacy      Satisfactory for evaluation; transformation zone component ABSENT.   Diagnosis      - Negative for intraepithelial lesion or malignancy  (NILM)  CBC with Differential/Platelet   Collection Time: 01/09/24 11:42 AM  Result Value Ref Range   WBC 6.8 3.8 - 10.8 Thousand/uL   RBC 4.98 3.80 - 5.10 Million/uL   Hemoglobin 13.3 11.7 - 15.5 g/dL   HCT 58.9 64.9 - 54.9 %   MCV 82.3 80.0 - 100.0 fL   MCH 26.7 (L) 27.0 - 33.0 pg   MCHC 32.4 32.0 - 36.0 g/dL   RDW 85.0 88.9 -  15.0 %   Platelets 265 140 - 400 Thousand/uL   MPV 10.6 7.5 - 12.5 fL   Neutro Abs 3,808 1,500 - 7,800 cells/uL   Absolute Lymphocytes 2,346 850 - 3,900 cells/uL   Absolute Monocytes 517 200 - 950 cells/uL   Eosinophils Absolute 102 15 - 500 cells/uL   Basophils Absolute 27 0 - 200 cells/uL   Neutrophils Relative % 56 %   Total Lymphocyte 34.5 %   Monocytes Relative 7.6 %   Eosinophils Relative 1.5 %   Basophils Relative 0.4 %  Comprehensive metabolic panel with GFR   Collection Time: 01/09/24 11:42 AM  Result Value Ref Range   Glucose, Bld 98 65 - 139 mg/dL   BUN 10 7 - 25 mg/dL   Creat 9.30 9.49 - 9.03 mg/dL   eGFR 872 > OR = 60 fO/fpw/8.26f7   BUN/Creatinine Ratio SEE NOTE: 6 - 22 (calc)   Sodium 141 135 - 146 mmol/L   Potassium 4.5 3.5 - 5.3 mmol/L   Chloride 107 98 - 110 mmol/L   CO2 23 20 - 32 mmol/L   Calcium 9.3 8.6 - 10.2 mg/dL   Total Protein 6.6 6.1 - 8.1 g/dL   Albumin 4.4 3.6 - 5.1 g/dL   Globulin 2.2 1.9 - 3.7 g/dL (calc)   AG Ratio 2.0 1.0 - 2.5 (calc)   Total Bilirubin 0.5 0.2 - 1.2 mg/dL   Alkaline phosphatase (APISO) 140 (H) 31 - 125 U/L   AST 15 10 - 30 U/L   ALT 13 6 - 29 U/L  Hemoglobin A1c   Collection Time: 01/09/24 11:42 AM  Result Value Ref Range   Hgb A1c MFr Bld 5.1 <5.7 % of total Hgb   Mean Plasma Glucose 100 mg/dL   eAG (mmol/L) 5.5 mmol/L  Hepatitis C antibody   Collection Time: 01/09/24 11:42 AM  Result Value Ref Range   Hepatitis C Ab NON-REACTIVE NON-REACTIVE  HIV Antibody (routine testing w rflx)   Collection Time: 01/09/24 11:42 AM  Result Value Ref Range   HIV 1&2 Ab, 4th Generation NON-REACTIVE  NON-REACTIVE  Lipid panel   Collection Time: 01/09/24 11:42 AM  Result Value Ref Range   Cholesterol 184 <200 mg/dL   HDL 41 (L) > OR = 50 mg/dL   Triglycerides 831 (H) <150 mg/dL   LDL Cholesterol (Calc) 115 (H) mg/dL (calc)   Total CHOL/HDL Ratio 4.5 <5.0 (calc)   Non-HDL Cholesterol (Calc) 143 (H) <130 mg/dL (calc)      Assessment & Plan:   Major depression, recurrent, chronic (HCC)  Anxiety Assessment & Plan: Per psychiatry   Psychophysiological insomnia  Migraine without aura and without status migrainosus, not intractable Assessment & Plan: Needs refill on her daily medications   Orders: -     Trokendi  XR; TAKE 1 CAPSULE AT BEDTIME  Dispense: 90 capsule; Refill: 3  Overweight (BMI 25.0-29.9) Assessment & Plan: Wt Readings from Last 5 Encounters:  06/08/24 159 lb (72.1 kg)  01/09/24 162 lb 1.6 oz (73.5 kg)  05/10/23 153 lb (69.4 kg)  01/10/23 131 lb 3.2 oz (59.5 kg)  12/12/22 120 lb (54.4 kg)   BMI Readings from Last 5 Encounters:  06/08/24 27.29 kg/m  01/09/24 27.82 kg/m  05/10/23 26.26 kg/m  01/10/23 22.52 kg/m  12/12/22 20.60 kg/m     Expand All Collapse All Name: Tanya Higgins   MRN: [id number]    DOB: 2001-08-28   Date:01/09/2024  Progress Note   Subjective   Chief Complaint      Chief Complaint  Patient presents with   Annual Exam   Obesity      HPI   Patient presents for annual CPE and discuss weight loss medication   Discussed the use of AI scribe software for clinical note transcription with the patient, who gave verbal consent to proceed.   History of Present Illness Tena Linebaugh is a 23 year old female who presents for a physical and to discuss weight loss medication.   She is experiencing weight gain despite eating somewhat healthily and exercising three to four times a week. The weight gain is primarily in her breasts and thighs, leading to back pain. She  identifies stress and emotional eating as contributing factors.   She has a history of depression and anxiety, with positive screenings for both conditions. She is currently taking Vraylar  1.5 mg daily, Buspar  15 mg twice daily, hydroxyzine  25 mg every eight hours, and Cymbalta  60 mg daily. Her anxiety and depression levels are very high today, and she is in the process of finding a psychiatrist, as her therapist is fully booked. She describes herself as extremely depressed.   In terms of lifestyle, she does not consume vegetables but eats other food groups. She reports difficulty sleeping nightly but manages to get eight to nine hours of sleep. She has not been to the dentist in almost a year and is due for an eye exam. She is not currently sexually active and has no history of pancreatitis or family history of thyroid cancer. Her last menstrual cycle started on March 10th, and she is expecting her next cycle to start tomorrow.     Diet: does not eat  veggies, but eats all the other food groups Exercise: 3-4 days a week. recommend 150 min of physical activity weekly    Sleep: 8 - 9 hours Last dental exam:about a year ago Last eye exam: due   Flowsheet Row Office Visit from 01/09/2024 in [facility]  AUDIT-C Score 1          Depression: Phq 9 is  positive, currently on medication vraylar  1.5 mg daily, buspar  15 mg BID, hydroxyzine  25 mg every 8 hours, Cymbalta  60 mg daily.  Recommend patient get established with psychiatry.      01/09/2024   11:04 AM 05/10/2023    1:28 PM 01/10/2023    1:16 PM 11/22/2022    1:35 PM 10/08/2022    1:30 PM  Depression screen PHQ 2/9  Decreased Interest 3 1 0 0 2  Down, Depressed, Hopeless 2 1 0 0 3  PHQ - 2 Score 5 2 0 0 5  Altered sleeping 3 1 0   1  Tired, decreased energy 3 1 0   2  Change in appetite 3 1 0   3  Feeling bad or failure about yourself  3 1 0   3  Trouble concentrating 1 0 0   2  Moving slowly or fidgety/restless 1  0 0   3  Suicidal thoughts 2 0 0   2  PHQ-9 Score 21 6 0   21  Difficult doing work/chores Somewhat difficult Very difficult Not difficult at all   Very difficult        01/09/2024   11:04 AM 05/10/2023    1:28 PM 10/08/2022    1:31 PM 01/24/2022    2:03 PM  GAD 7 : Generalized Anxiety  Score  Nervous, Anxious, on Edge 1 2 3 1   Control/stop worrying 1 2 0 0  Worry too much - different things 1 2 2  0  Trouble relaxing 3 2 1 1   Restless 3 2 1 3   Easily annoyed or irritable 2 2 3  0  Afraid - awful might happen 1 2 2 1   Total GAD 7 Score 12 14 12 6   Anxiety Difficulty Somewhat difficult Very difficult Somewhat difficult Not difficult at all      Hypertension:    BP Readings from Last 3 Encounters:  01/09/24 116/72  05/10/23 114/72  01/10/23 112/68    Obesity:    Wt Readings from Last 3 Encounters:  01/09/24 162 lb 1.6 oz (73.5 kg)  05/10/23 153 lb (69.4 kg)  01/10/23 131 lb 3.2 oz (59.5 kg)       BMI Readings from Last 3 Encounters:  01/09/24 27.82 kg/m  05/10/23 26.26 kg/m  01/10/23 22.52 kg/m      Vaccines:  HPV: up to at age 87 , ask insurance if age between 34-45  Shingrix: 59-64 yo and ask insurance if covered when patient above 39 yo Pneumonia:  educated and discussed with patient. Flu:  educated and discussed with patient.   Hep C Screening: due STD testing and prevention (HIV/chl/gon/syphilis): due Intimate partner violence:none Sexual History : not currently sexually active Menstrual History/LMP/Abnormal Bleeding: [facility]: 12/09/2023 Incontinence Symptoms: none   Breast cancer:  - Last Mammogram: does not qualify - BRCA gene screening: none   Osteoporosis: Discussed high calcium and vitamin D supplementation, weight bearing exercises   Cervical cancer screening: due   Skin cancer: Discussed monitoring for atypical lesions  Colorectal cancer: does not qualify   Lung cancer:   Low Dose CT Chest recommended if Age 67-80 years, 20 pack-year currently smoking  OR have quit w/in 15years. Patient does not qualify.   ECG: none   Advanced Care Planning: A voluntary discussion about advance care planning including the explanation and discussion of advance directives.  Discussed health care proxy and Living will, and the patient was able to identify a health care proxy as mom.  Patient does not have a living will at present time. If patient does have living will, I have requested they bring this to the clinic to be scanned in to their chart.   Lipids: Recent Labs       Lab Results  Component Value Date    CHOL 140 08/01/2018      Recent Labs  No results found for: HDL   Recent Labs  No results found for: LDLCALC   Recent Labs  No results found for: TRIG   Recent Labs  No results found for: CHOLHDL   Recent Labs  No results found for: LDLDIRECT     Glucose: Last Labs         Glucose, Bld  Date Value Ref Range Status  01/10/2022 87 65 - 99 mg/dL Final      Comment:      .            Fasting reference interval .    10/30/2020 113 (H) 70 - 99 mg/dL Final      Comment:      Glucose reference range applies only to samples taken after fasting for at least 8 hours.  01/10/2020 153 (H) 70 - 99 mg/dL Final      Comment:      Glucose reference range applies only to samples taken after  fasting for at least 8 hours.            Patient Active Problem List    Diagnosis Date Noted   Overweight (BMI 25.0-29.9) 01/09/2024   Severe episode of recurrent major depressive disorder, without psychotic features (HCC) 10/08/2022   Blood in stool 01/10/2022   Hallucinations 06/19/2019   Loss of weight 06/19/2019   Panic 02/10/2018   Tension headache 08/16/2017   Migraine without aura and without status migrainosus, not intractable 08/14/2017   Allergic rhinitis, seasonal 06/17/2015   Anxiety 05/07/2015   Picky eater 05/07/2015   Isolated phobia 05/07/2015           Past Surgical History:  Procedure Laterality Date   FRACTURE  SURGERY                   Family History  Problem Relation Age of Onset   Depression Mother     Hypertension Mother     Asthma Mother     Migraines Mother     Diabetes Mother     Alcohol abuse Father     Diabetes Sister            Social History         Socioeconomic History   Marital status: Single      Spouse name: Not on file   Number of children: Not on file   Years of education: Not on file   Highest education level: Some college, no degree  Occupational History   Occupation: Consulting civil engineer   Tobacco Use   Smoking status: Passive Smoke Exposure - Never Smoker   Smokeless tobacco: Never   Tobacco comments:      i havent smoked a cigarette in months, didnt like it  Vaping Use   Vaping status: Never Used  Substance and Sexual Activity   Alcohol use: No   Drug use: No   Sexual activity: Not Currently      Birth control/protection: None  Other Topics Concern   Not on file  Social History Narrative    Tametria is a 12 th grade student.    She attends [employer], but virtually since 10/2018     She lives with both parents. She has one sister.    Social Drivers of Health        Financial Resource Strain: Patient Declined (01/07/2024)    Overall Financial Resource Strain (CARDIA)     Difficulty of Paying Living Expenses: Patient declined  Food Insecurity: No Food Insecurity (01/09/2024)    Hunger Vital Sign     Worried About Running Out of Food in the Last Year: Never true     Ran Out of Food in the Last Year: Never true  Recent Concern: Food Insecurity - Food Insecurity Present (01/07/2024)    Hunger Vital Sign     Worried About Running Out of Food in the Last Year: Sometimes true     Ran Out of Food in the Last Year: Sometimes true  Transportation Needs: No Transportation Needs (01/09/2024)    PRAPARE - Therapist, art (Medical): No     Lack of Transportation (Non-Medical): No  Recent Concern: Transportation Needs - Unmet  Transportation Needs (01/07/2024)    PRAPARE - Transportation     Lack of Transportation (Medical): No     Lack of Transportation (Non-Medical): Yes  Physical Activity: Inactive (01/09/2024)    Exercise Vital Sign     Days of Exercise per  Week: 0 days     Minutes of Exercise per Session: 0 min  Stress: Stress Concern Present (01/07/2024)    [employer] of Occupational Health - Occupational Stress Questionnaire     Feeling of Stress : Rather much  Social Connections: Moderately Isolated (01/09/2024)    Social Connection and Isolation Panel [NHANES]     Frequency of Communication with Friends and Family: Three times a week     Frequency of Social Gatherings with Friends and Family: More than three times a week     Attends Religious Services: Never     Database administrator or Organizations: No     Attends Engineer, structural: More than 4 times per year     Marital Status: Never married  Intimate Partner Violence: Not At Risk (01/09/2024)    Humiliation, Afraid, Rape, and Kick questionnaire     Fear of Current or Ex-Partner: No     Emotionally Abused: No     Physically Abused: No     Sexually Abused: No       Current Medications    Current Outpatient Medications:    busPIRone  (BUSPAR ) 15 MG tablet, Take 1 tablet (15 mg total) by mouth 2 (two) times daily., Disp: 180 tablet, Rfl: 0   cariprazine  (VRAYLAR ) 1.5 MG capsule, TAKE 1 CAPSULE BY MOUTH DAILY., Disp: 90 capsule, Rfl: 1   DULoxetine  (CYMBALTA ) 60 MG capsule, TAKE 1 CAPSULE DAILY, Disp: 90 capsule, Rfl: 3   hydrOXYzine  (ATARAX ) 25 MG tablet, Take 1 tablet (25 mg total) by mouth every 8 (eight) hours as needed., Disp: 90 tablet, Rfl: 3   loratadine  (CLARITIN ) 10 MG tablet, Take 1 tablet (10 mg total) by mouth daily., Disp: 30 tablet, Rfl: 11   TROKENDI  XR 50 MG CP24, TAKE 1 CAPSULE AT BEDTIME, Disp: 90 capsule, Rfl: 3   WEGOVY  0.25 MG/0.5ML SOAJ, Inject 0.25 mg into the skin once a week. Use this dose for 1 month (4  shots), Disp: 2 mL, Rfl: 0     Allergies  No Known Allergies       ROS   Constitutional: Negative for fever or weight change.  Respiratory: Negative for cough and shortness of breath.   Cardiovascular: Negative for chest pain or palpitations.  Gastrointestinal: Negative for abdominal pain, no bowel changes.  Musculoskeletal: Negative for gait problem or joint swelling.  Skin: Negative for rash.  Neurological: Negative for dizziness or headache.  No other specific complaints in a complete review of systems (except as listed in HPI above).    Objective      Vitals:    01/09/24 1057  BP: 116/72  Pulse: (!) 109  Resp: 18  SpO2: 97%  Weight: 162 lb 1.6 oz (73.5 kg)  Height: 5' 4 (1.626 m)      Body mass index is 27.82 kg/m.   Physical Exam Vitals reviewed. Exam conducted with a chaperone present.  Constitutional:      Appearance: Normal appearance.  HENT:     Head: Normocephalic.     Right Ear: Tympanic membrane normal.     Left Ear: Tympanic membrane normal.     Nose: Nose normal.  Eyes:     Extraocular Movements: Extraocular movements intact.     Conjunctiva/sclera: Conjunctivae normal.     Pupils: Pupils are equal, round, and reactive to light.  Neck:     Thyroid: No thyroid mass, thyromegaly or thyroid tenderness.  Cardiovascular:     Rate and Rhythm: Normal rate  and regular rhythm.     Pulses: Normal pulses.     Heart sounds: Normal heart sounds.  Pulmonary:     Effort: Pulmonary effort is normal.     Breath sounds: Normal breath sounds.  Chest:  Breasts:    Right: Normal.     Left: Normal.  Abdominal:     General: Bowel sounds are normal.     Palpations: Abdomen is soft.  Genitourinary:    Vagina: Normal.     Cervix: Normal.     Uterus: Normal.      Adnexa: Right adnexa normal and left adnexa normal.  Musculoskeletal:        General: Normal range of motion.     Cervical back: Normal range of motion and neck supple.     Right lower leg: No  edema.     Left lower leg: No edema.  Skin:    General: Skin is warm and dry.     Capillary Refill: Capillary refill takes less than 2 seconds.  Neurological:     General: No focal deficit present.     Mental Status: She is alert and oriented to person, place, and time. Mental status is at baseline.  Psychiatric:        Mood and Affect: Mood normal.        Behavior: Behavior normal.        Thought Content: Thought content normal.        Judgment: Judgment normal.          Fall Risk:     01/09/2024   10:58 AM 05/10/2023    1:28 PM 01/10/2023    1:15 PM 11/22/2022    1:35 PM 06/18/2022   12:57 PM  Fall Risk   Falls in the past year? 0 0 0 0 0  Number falls in past yr: 0 0 0 0 0  Injury with Fall? 0 0 0 0 0  Risk for fall due to :   No Fall Risks No Fall Risks      Follow up Falls evaluation completed Falls prevention discussed;Education provided;Falls evaluation completed Falls prevention discussed;Education provided;Falls evaluation completed   Falls evaluation completed        Functional Status Survey: Is the patient deaf or have difficulty hearing?: No Does the patient have difficulty seeing, even when wearing glasses/contacts?: No Does the patient have difficulty concentrating, remembering, or making decisions?: No Does the patient have difficulty walking or climbing stairs?: No Does the patient have difficulty dressing or bathing?: No Does the patient have difficulty doing errands alone such as visiting a doctor's office or shopping?: No     Assessment & Plan   Problem List Items Addressed This Visit              Other    Anxiety    Severe episode of recurrent major depressive disorder, without psychotic features (HCC)    Overweight (BMI 25.0-29.9)    Relevant Medications    WEGOVY  0.25 MG/0.5ML SOAJ    RESOLVED: Major depression, recurrent, chronic (HCC)    Other Visit Diagnoses         Annual physical exam    -  Primary    Relevant Orders    CBC with  Differential/Platelet    Comprehensive metabolic panel with GFR    Hemoglobin A1c    Hepatitis C antibody    HIV Antibody (routine testing w rflx)    Lipid panel      Screening for deficiency  anemia        Relevant Orders    CBC with Differential/Platelet      Screening for HIV without presence of risk factors        Relevant Orders    HIV Antibody (routine testing w rflx)      Screening for cholesterol level        Relevant Orders    Lipid panel      Screening for diabetes mellitus        Relevant Orders    Comprehensive metabolic panel with GFR    Hemoglobin A1c      Encounter for hepatitis C screening test for low risk patient        Relevant Orders    Hepatitis C antibody      Screening for cervical cancer        Relevant Orders    Cytology - PAP      Macromastia        Relevant Orders    Ambulatory referral to Plastic Surgery        Assessment and Plan Assessment & Plan Overweight BMI is 27.82, indicating overweight status. She expresses insecurity about her weight and reports weight gain despite a somewhat healthy diet and exercising 3-4 times weekly. She is interested in weight loss medication, specifically Wegovy , with no personal history of pancreatitis or family history of thyroid cancer. The delivery system of Wegovy  was discussed, and she was advised to discontinue if pregnancy occurs. Wegovy  is considered due to its potential to aid in weight loss, which may also help reduce breast size and associated back pain. Pt wishes to try and get zepbound  approved since she did not tolerate wegovy   Discussed dosing and SE and pt instructed not to do 2nd or subsequent doses if experiencing N/V daily  Continue to work on diet/nutrition, exercise and pt will send in details about current efforts      Orders: -     Zepbound ; Inject 2.5 mg into the skin once a week.  Dispense: 2 mL; Refill: 1     Return for 2-3 month f/up Mliss .   Michelene Cower, PA-C 06/08/24 2:14  PM

## 2024-06-08 NOTE — Assessment & Plan Note (Signed)
 Needs refill on her daily medications

## 2024-06-08 NOTE — Assessment & Plan Note (Signed)
Per psychiatry 

## 2024-06-09 NOTE — Telephone Encounter (Signed)
 Requested medication (s) are due for refill today: Pharmacy comment: Alternative Requested:PA.   Requested medication (s) are on the active medication list: {Yes  Last refill:  06/09/24  Future visit scheduled: {Yes  Notes to clinic:  Pharmacy comment: Alternative Requested:PA.      Requested Prescriptions  Pending Prescriptions Disp Refills   Liraglutide -Weight Management 18 MG/3ML SOPN [Pharmacy Med Name: LIRAGLUTIDE 5-PAK 18 MG/3 ML]  0     Endocrinology:  Diabetes - GLP-1 Receptor Agonists Passed - 06/09/2024  4:01 PM      Passed - HBA1C is between 0 and 7.9 and within 180 days    Hgb A1c MFr Bld  Date Value Ref Range Status  01/09/2024 5.1 <5.7 % of total Hgb Final    Comment:    For the purpose of screening for the presence of diabetes: . <5.7%       Consistent with the absence of diabetes 5.7-6.4%    Consistent with increased risk for diabetes             (prediabetes) > or =6.5%  Consistent with diabetes . This assay result is consistent with a decreased risk of diabetes. . Currently, no consensus exists regarding use of hemoglobin A1c for diagnosis of diabetes in children. . According to American Diabetes Association (ADA) guidelines, hemoglobin A1c <7.0% represents optimal control in non-pregnant diabetic patients. Different metrics may apply to specific patient populations.  Standards of Medical Care in Diabetes(ADA). SABRA Amy - Valid encounter within last 6 months    Recent Outpatient Visits           Yesterday Major depression, recurrent, chronic [facility])   [place] [facility] Leavy Mole, PA-C   5 months ago Annual physical exam   [facility] Gareth Mliss FALCON, FNP       Future Appointments             In 3 months Gareth, Mliss FALCON, FNP [facility], [place]   In 7 months Leavy Mole, PA-C [facility], [place]

## 2024-06-12 ENCOUNTER — Other Ambulatory Visit (HOSPITAL_COMMUNITY): Payer: Self-pay

## 2024-06-12 NOTE — Telephone Encounter (Signed)
 Unfortunately Zepbound  is plan/benefit exclusion. Saxenda is on the plan formulary but would need a PA

## 2024-06-15 ENCOUNTER — Ambulatory Visit: Admitting: Family Medicine

## 2024-06-18 ENCOUNTER — Encounter: Payer: Self-pay | Admitting: Family Medicine

## 2024-06-19 ENCOUNTER — Telehealth: Payer: Self-pay | Admitting: Pharmacy Technician

## 2024-06-19 ENCOUNTER — Other Ambulatory Visit: Payer: Self-pay | Admitting: Nurse Practitioner

## 2024-06-19 ENCOUNTER — Other Ambulatory Visit (HOSPITAL_COMMUNITY): Payer: Self-pay

## 2024-06-19 DIAGNOSIS — E663 Overweight: Secondary | ICD-10-CM

## 2024-06-19 MED ORDER — LIRAGLUTIDE -WEIGHT MANAGEMENT 18 MG/3ML ~~LOC~~ SOPN: 0.6000 mg | PEN_INJECTOR | Freq: Every day | SUBCUTANEOUS | 1 refills | Status: DC

## 2024-06-19 NOTE — Telephone Encounter (Signed)
 Good morning Tanya Higgins, unfortunately [facility] [employer] sent out letters that beginning April 30, 2024 they would no longer cover Zepbound  for weight loss but they would cover wegovy . They are covering zepbound  exclusively for OSA with obesity and the patient has to have a sleep study to prove OSA

## 2024-06-19 NOTE — Telephone Encounter (Signed)
 Pharmacy Patient Advocate Encounter   Received notification from CoverMyMeds that prior authorization for Liraglutide  -Weight Management 18MG /3ML pen-injectors  is required/requested.   Insurance verification completed.   The patient is insured through [facility] .   Per test claim: PA required; PA submitted to above mentioned insurance via Latent Key/confirmation #/EOC [id number] Status is pending

## 2024-06-22 NOTE — Telephone Encounter (Signed)
 Pharmacy Patient Advocate Encounter  Received notification from [facility] that Prior Authorization for Liraglutide  -Weight Management 18MG /3ML pen-injectors  has been DENIED.  Full denial letter will be uploaded to the media tab. See denial reason below.     PA #/Case ID/Reference #: [id number]

## 2024-07-05 ENCOUNTER — Other Ambulatory Visit: Payer: Self-pay | Admitting: Family Medicine

## 2024-07-05 DIAGNOSIS — E663 Overweight: Secondary | ICD-10-CM

## 2024-07-07 NOTE — Telephone Encounter (Signed)
 Discontinued on 06/08/24, side effect.  Requested Prescriptions  Pending Prescriptions Disp Refills   WEGOVY  0.25 MG/0.5ML SOAJ SQ injection [Pharmacy Med Name: WEGOVY  0.25 MG/0.5 ML PEN]      Sig: INJECT 0.25 MG INTO THE SKIN ONCE A WEEK. USE THIS DOSE FOR 1 MONTH (4 SHOTS)     Endocrinology:  Diabetes - GLP-1 Receptor Agonists - semaglutide  Passed - 07/07/2024 12:22 PM      Passed - HBA1C in normal range and within 180 days    Hgb A1c MFr Bld  Date Value Ref Range Status  01/09/2024 5.1 <5.7 % of total Hgb Final    Comment:    For the purpose of screening for the presence of diabetes: . <5.7%       Consistent with the absence of diabetes 5.7-6.4%    Consistent with increased risk for diabetes             (prediabetes) > or =6.5%  Consistent with diabetes . This assay result is consistent with a decreased risk of diabetes. . Currently, no consensus exists regarding use of hemoglobin A1c for diagnosis of diabetes in children. . According to American Diabetes Association (ADA) guidelines, hemoglobin A1c <7.0% represents optimal control in non-pregnant diabetic patients. Different metrics may apply to specific patient populations.  Standards of Medical Care in Diabetes(ADA). .          Passed - Cr in normal range and within 360 days    Creat  Date Value Ref Range Status  01/09/2024 0.69 0.50 - 0.96 mg/dL Final         Passed - Valid encounter within last 6 months    Recent Outpatient Visits           4 weeks ago Major depression, recurrent, chronic   [place] [name] Leavy Mole, PA-C   6 months ago Annual physical exam   [facility] Gareth Mliss FALCON, FNP       Future Appointments             In 2 months Gareth, Mliss FALCON, FNP [facility], [place]   In 6 months Leavy Mole, PA-C [facility], [place]

## 2024-07-13 ENCOUNTER — Other Ambulatory Visit: Payer: Self-pay | Admitting: Nurse Practitioner

## 2024-07-13 DIAGNOSIS — E663 Overweight: Secondary | ICD-10-CM

## 2024-07-14 ENCOUNTER — Encounter: Payer: Self-pay | Admitting: Nurse Practitioner

## 2024-07-14 NOTE — Telephone Encounter (Signed)
 Requested Prescriptions  Refused Prescriptions Disp Refills   WEGOVY  0.25 MG/0.5ML SOAJ SQ injection [Pharmacy Med Name: WEGOVY  0.25 MG/0.5 ML PEN]      Sig: INJECT 0.25 MG INTO THE SKIN ONCE A WEEK. USE THIS DOSE FOR 1 MONTH (4 SHOTS)     Endocrinology:  Diabetes - GLP-1 Receptor Agonists - semaglutide  Failed - 07/14/2024  5:40 PM      Failed - HBA1C in normal range and within 180 days    Hgb A1c MFr Bld  Date Value Ref Range Status  01/09/2024 5.1 <5.7 % of total Hgb Final    Comment:    For the purpose of screening for the presence of diabetes: . <5.7%       Consistent with the absence of diabetes 5.7-6.4%    Consistent with increased risk for diabetes             (prediabetes) > or =6.5%  Consistent with diabetes . This assay result is consistent with a decreased risk of diabetes. . Currently, no consensus exists regarding use of hemoglobin A1c for diagnosis of diabetes in children. . According to American Diabetes Association (ADA) guidelines, hemoglobin A1c <7.0% represents optimal control in non-pregnant diabetic patients. Different metrics may apply to specific patient populations.  Standards of Medical Care in Diabetes(ADA). .          Passed - Cr in normal range and within 360 days    Creat  Date Value Ref Range Status  01/09/2024 0.69 0.50 - 0.96 mg/dL Final         Passed - Valid encounter within last 6 months    Recent Outpatient Visits           1 month ago Major depression, recurrent, chronic   [place] [facility] Leavy Mole, PA-C   6 months ago Annual physical exam   [facility] Gareth Mliss FALCON, FNP       Future Appointments             In 1 month Gareth, Mliss FALCON, FNP [facility], [place]   In 6 months Leavy Mole, PA-C [facility], [place]

## 2024-07-15 ENCOUNTER — Other Ambulatory Visit: Payer: Self-pay | Admitting: Nurse Practitioner

## 2024-07-15 DIAGNOSIS — E663 Overweight: Secondary | ICD-10-CM

## 2024-07-15 MED ORDER — WEGOVY 0.25 MG/0.5ML ~~LOC~~ SOAJ: 0.2500 mg | SUBCUTANEOUS | 0 refills | Status: DC

## 2024-07-20 ENCOUNTER — Ambulatory Visit: Admitting: Nurse Practitioner

## 2024-07-30 ENCOUNTER — Ambulatory Visit: Admitting: Nurse Practitioner

## 2024-08-10 ENCOUNTER — Other Ambulatory Visit: Payer: Self-pay | Admitting: Nurse Practitioner

## 2024-08-10 DIAGNOSIS — E663 Overweight: Secondary | ICD-10-CM

## 2024-08-12 NOTE — Telephone Encounter (Signed)
 Requested Prescriptions  Pending Prescriptions Disp Refills   WEGOVY  0.25 MG/0.5ML SOAJ SQ injection [Pharmacy Med Name: WEGOVY  0.25 MG/0.5 ML PEN] 9 mL 0    Sig: INJECT 0.25 MG INTO SKIN ONCE A WEEK. USE THIS DOSE FOR 1 MONTH. THEN INCREASE TO NEXT HIGHER DOSE.     Endocrinology:  Diabetes - GLP-1 Receptor Agonists - semaglutide  Failed - 08/12/2024 11:24 AM      Failed - HBA1C in normal range and within 180 days    Hgb A1c MFr Bld  Date Value Ref Range Status  01/09/2024 5.1 <5.7 % of total Hgb Final    Comment:    For the purpose of screening for the presence of diabetes: . <5.7%       Consistent with the absence of diabetes 5.7-6.4%    Consistent with increased risk for diabetes             (prediabetes) > or =6.5%  Consistent with diabetes . This assay result is consistent with a decreased risk of diabetes. . Currently, no consensus exists regarding use of hemoglobin A1c for diagnosis of diabetes in children. . According to American Diabetes Association (ADA) guidelines, hemoglobin A1c <7.0% represents optimal control in non-pregnant diabetic patients. Different metrics may apply to specific patient populations.  Standards of Medical Care in Diabetes(ADA). .          Passed - Cr in normal range and within 360 days    Creat  Date Value Ref Range Status  01/09/2024 0.69 0.50 - 0.96 mg/dL Final         Passed - Valid encounter within last 6 months    Recent Outpatient Visits           2 months ago Major depression, recurrent, chronic   [place] [facility] Leavy Mole, PA-C   7 months ago Annual physical exam   [facility] Gareth Mliss FALCON, FNP       Future Appointments             In 3 weeks Gareth, Mliss FALCON, FNP [facility] [place]   In 5 months Leavy Mole, PA-C [facility], [place]

## 2024-08-17 ENCOUNTER — Other Ambulatory Visit (HOSPITAL_COMMUNITY): Payer: Self-pay

## 2024-08-21 ENCOUNTER — Other Ambulatory Visit (HOSPITAL_COMMUNITY): Payer: Self-pay

## 2024-09-08 ENCOUNTER — Ambulatory Visit: Admitting: Nurse Practitioner

## 2024-09-14 ENCOUNTER — Telehealth: Payer: Self-pay | Admitting: Pharmacy Technician

## 2024-09-14 ENCOUNTER — Other Ambulatory Visit (HOSPITAL_COMMUNITY): Payer: Self-pay

## 2024-09-14 NOTE — Telephone Encounter (Signed)
 Pharmacy Patient Advocate Encounter   Received notification from Onbase that prior authorization for Wegovy  0.25MG /0.5ML auto-injectors is due for renewal.   Insurance verification completed.   The patient is insured through [facility].  In order to submit the PA we need progress note and we need a current weight and BMI within the last 45 days.  Thanks!

## 2024-09-17 ENCOUNTER — Other Ambulatory Visit (HOSPITAL_COMMUNITY): Payer: Self-pay

## 2024-09-21 ENCOUNTER — Other Ambulatory Visit (HOSPITAL_COMMUNITY): Payer: Self-pay

## 2024-09-25 ENCOUNTER — Telehealth: Payer: Self-pay | Admitting: Pharmacy Technician

## 2024-09-25 ENCOUNTER — Encounter: Payer: Self-pay | Admitting: Nurse Practitioner

## 2024-09-25 ENCOUNTER — Ambulatory Visit: Admitting: Nurse Practitioner

## 2024-09-25 ENCOUNTER — Other Ambulatory Visit (HOSPITAL_COMMUNITY): Payer: Self-pay

## 2024-09-25 VITALS — BP 102/70 | HR 75 | Temp 98.0°F | Ht 64.0 in | Wt 142.0 lb

## 2024-09-25 DIAGNOSIS — Z8639 Personal history of other endocrine, nutritional and metabolic disease: Secondary | ICD-10-CM

## 2024-09-25 DIAGNOSIS — Z23 Encounter for immunization: Secondary | ICD-10-CM

## 2024-09-25 DIAGNOSIS — Z6824 Body mass index (BMI) 24.0-24.9, adult: Secondary | ICD-10-CM | POA: Diagnosis not present

## 2024-09-25 DIAGNOSIS — L0591 Pilonidal cyst without abscess: Secondary | ICD-10-CM

## 2024-09-25 MED ORDER — FLUCONAZOLE 150 MG PO TABS: 150.0000 mg | ORAL_TABLET | ORAL | 0 refills | Status: AC | PRN

## 2024-09-25 MED ORDER — WEGOVY 0.25 MG/0.5ML ~~LOC~~ SOAJ: 0.2500 mg | SUBCUTANEOUS | 3 refills | Status: AC

## 2024-09-25 MED ORDER — SULFAMETHOXAZOLE-TRIMETHOPRIM 800-160 MG PO TABS: 1.0000 | ORAL_TABLET | Freq: Two times a day (BID) | ORAL | 0 refills | Status: AC

## 2024-09-25 NOTE — Telephone Encounter (Signed)
 Pharmacy Patient Advocate Encounter   Received notification from CoverMyMeds that prior authorization for Wegovy  0.25MG /0.5ML auto-injectors is required/requested.   Insurance verification completed.   The patient is insured through [facility].   Per test claim: PA required; PA started via CoverMyMeds. KEY [id number] . Waiting for clinical questions to populate.

## 2024-09-25 NOTE — Progress Notes (Addendum)
 "  BP 102/70   Pulse 75   Temp 98 F (36.7 C)   Ht 5' 4 (1.626 m)   Wt 142 lb (64.4 kg)   SpO2 99%   BMI 24.37 kg/m    Subjective:    Patient ID: Tanya Higgins, female    DOB: 2001/07/14, 23 y.o.   MRN: [id number]  HPI: Tanya Higgins is a 23 y.o. female  Chief Complaint  Patient presents with   Cyst    Pt c/o cyst on low back x4 days.    Medication Refill   Discussed the use of AI scribe software for clinical note transcription with the patient, who gave verbal consent to proceed.  History of Present Illness Tanya Higgins is a 23 year old female who presents with a pilonidal cyst on her low back.  Pilonidal cyst - Cyst located on the low back, present for four days - Initially very painful, currently mostly uncomfortable and feels hard - Significant drainage described as brownish, yellowish, and reddish, similar to pus - Initial drainage included some blood, currently no longer bloody - No prior surgical evaluation for this issue - History of similar cyst in the past - Warm compresses and heating pad used continuously, perceived improvement in drainage - Salve applied to aid in drainage  Psychiatric history - Depression and anxiety managed by psychiatry - Currently taking Vraylar  3 mg daily and Pristiq 25 mg  Migraine management - Takes Trokendi  for migraine prophylaxis  Weight loss and obesity - History of obesity, currently in normal weight class with BMI 24.3 - Weight decreased from 162 pounds in April 2025 to 142 pounds currently - Using Wegovy  0.25 mg weekly - Recent weight loss of six to seven pounds - Tolerates Wegovy  well after changing injection site from stomach to thighs     Wt Readings from Last 3 Encounters:  09/25/24 142 lb (64.4 kg)  06/08/24 159 lb (72.1 kg)  01/09/24 162 lb 1.6 oz (73.5 kg)   Body mass index is 24.37 kg/m.  Flowsheet Row Office Visit from 09/25/2024 in [facility]  1 36 inches  -  Encourage continuation of lifestyle modifications, including dietary management and regular exercise. -continue to increase physical activity, getting at least 150 min of physical activity a week.  Work on including runner, broadcasting/film/video 2 days a week.  - continue eating at a calorie deficit 1600-1700 cal a day, eating a well balanced diet with whole foods, avoiding processed foods.   Patient is motivated to continue working on lifestyle modification.        09/25/2024   10:13 AM 06/08/2024    2:17 PM 01/09/2024   11:04 AM  Depression screen PHQ 2/9  Decreased Interest 1 2 3   Down, Depressed, Hopeless 3 2 2   PHQ - 2 Score 4 4 5   Altered sleeping 2 0 3  Tired, decreased energy 2 3 3   Change in appetite 1 1 3   Feeling bad or failure about yourself  3 3 3   Trouble concentrating 1 0 1  Moving slowly or fidgety/restless 0 0 1  Suicidal thoughts 3 1 2   PHQ-9 Score 16 12  21    Difficult doing work/chores Very difficult  Somewhat difficult     Data saved with a previous flowsheet row definition       09/25/2024   10:13 AM 06/08/2024    2:18 PM 01/09/2024   11:04 AM 05/10/2023    1:28 PM  GAD 7 :  Generalized Anxiety Score  Nervous, Anxious, on Edge 2 3 1 2   Control/stop worrying 0 2 1 2   Worry too much - different things 1 2 1 2   Trouble relaxing 2 0 3 2  Restless 1 0 3 2  Easily annoyed or irritable 1 1 2 2   Afraid - awful might happen 3 3 1 2   Total GAD 7 Score 10 11 12 14   Anxiety Difficulty Very difficult  Somewhat difficult Very difficult     Relevant past medical, surgical, family and social history reviewed and updated as indicated. Interim medical history since our last visit reviewed. Allergies and medications reviewed and updated.  Review of Systems  Ten systems reviewed and is negative except as mentioned in HPI      Objective:      BP 102/70   Pulse 75   Temp 98 F (36.7 C)   Ht 5' 4 (1.626 m)   Wt 142 lb (64.4 kg)   SpO2 99%   BMI 24.37 kg/m    Wt Readings  from Last 3 Encounters:  09/25/24 142 lb (64.4 kg)  06/08/24 159 lb (72.1 kg)  01/09/24 162 lb 1.6 oz (73.5 kg)    Physical Exam MEASUREMENTS: Weight- 142, BMI- 24.3. GENERAL: Alert, cooperative, well developed, no acute distress. HEENT: Normocephalic, normal oropharynx, moist mucous membranes. CHEST: Clear to auscultation bilaterally, no wheezes, rhonchi, or crackles. CARDIOVASCULAR: Normal heart rate and rhythm, S1 and S2 normal without murmurs. ABDOMEN: Soft, non-tender, non-distended, without organomegaly, normal bowel sounds. EXTREMITIES: No cyanosis or edema. NEUROLOGICAL: Cranial nerves grossly intact, moves all extremities without gross motor or sensory deficit. SKIN: Pilonidal cyst without signs of infection, no cellulitis.  Results for orders placed or performed in visit on 01/09/24  Cytology - PAP   Collection Time: 01/09/24 11:20 AM  Result Value Ref Range   Adequacy      Satisfactory for evaluation; transformation zone component ABSENT.   Diagnosis      - Negative for intraepithelial lesion or malignancy (NILM)  CBC with Differential/Platelet   Collection Time: 01/09/24 11:42 AM  Result Value Ref Range   WBC 6.8 3.8 - 10.8 Thousand/uL   RBC 4.98 3.80 - 5.10 Million/uL   Hemoglobin 13.3 11.7 - 15.5 g/dL   HCT 58.9 64.9 - 54.9 %   MCV 82.3 80.0 - 100.0 fL   MCH 26.7 (L) 27.0 - 33.0 pg   MCHC 32.4 32.0 - 36.0 g/dL   RDW 85.0 88.9 - 84.9 %   Platelets 265 140 - 400 Thousand/uL   MPV 10.6 7.5 - 12.5 fL   Neutro Abs 3,808 1,500 - 7,800 cells/uL   Absolute Lymphocytes 2,346 850 - 3,900 cells/uL   Absolute Monocytes 517 200 - 950 cells/uL   Eosinophils Absolute 102 15 - 500 cells/uL   Basophils Absolute 27 0 - 200 cells/uL   Neutrophils Relative % 56 %   Total Lymphocyte 34.5 %   Monocytes Relative 7.6 %   Eosinophils Relative 1.5 %   Basophils Relative 0.4 %  Comprehensive metabolic panel with GFR   Collection Time: 01/09/24 11:42 AM  Result Value Ref Range    Glucose, Bld 98 65 - 139 mg/dL   BUN 10 7 - 25 mg/dL   Creat 9.30 9.49 - 9.03 mg/dL   eGFR 872 > OR = 60 fO/fpw/8.26f7   BUN/Creatinine Ratio SEE NOTE: 6 - 22 (calc)   Sodium 141 135 - 146 mmol/L   Potassium 4.5 3.5 - 5.3 mmol/L  Chloride 107 98 - 110 mmol/L   CO2 23 20 - 32 mmol/L   Calcium 9.3 8.6 - 10.2 mg/dL   Total Protein 6.6 6.1 - 8.1 g/dL   Albumin 4.4 3.6 - 5.1 g/dL   Globulin 2.2 1.9 - 3.7 g/dL (calc)   AG Ratio 2.0 1.0 - 2.5 (calc)   Total Bilirubin 0.5 0.2 - 1.2 mg/dL   Alkaline phosphatase (APISO) 140 (H) 31 - 125 U/L   AST 15 10 - 30 U/L   ALT 13 6 - 29 U/L  Hemoglobin A1c   Collection Time: 01/09/24 11:42 AM  Result Value Ref Range   Hgb A1c MFr Bld 5.1 <5.7 % of total Hgb   Mean Plasma Glucose 100 mg/dL   eAG (mmol/L) 5.5 mmol/L  Hepatitis C antibody   Collection Time: 01/09/24 11:42 AM  Result Value Ref Range   Hepatitis C Ab NON-REACTIVE NON-REACTIVE  HIV Antibody (routine testing w rflx)   Collection Time: 01/09/24 11:42 AM  Result Value Ref Range   HIV 1&2 Ab, 4th Generation NON-REACTIVE NON-REACTIVE  Lipid panel   Collection Time: 01/09/24 11:42 AM  Result Value Ref Range   Cholesterol 184 <200 mg/dL   HDL 41 (L) > OR = 50 mg/dL   Triglycerides 831 (H) <150 mg/dL   LDL Cholesterol (Calc) 115 (H) mg/dL (calc)   Total CHOL/HDL Ratio 4.5 <5.0 (calc)   Non-HDL Cholesterol (Calc) 143 (H) <130 mg/dL (calc)          Assessment & Plan:   Problem List Items Addressed This Visit       Other   Overweight (BMI 25.0-29.9) - Primary   Relevant Medications   WEGOVY  0.25 MG/0.5ML SOAJ SQ injection   Other Visit Diagnoses       Immunization due       Relevant Orders   Flu vaccine trivalent PF, 6mos and older(Flulaval,Afluria,Fluarix,Fluzone) (Completed)     Pilonidal cyst       Relevant Medications   sulfamethoxazole -trimethoprim  (BACTRIM  DS) 800-160 MG tablet   fluconazole  (DIFLUCAN ) 150 MG tablet   Other Relevant Orders   Ambulatory referral  to General Surgery        Assessment and Plan Assessment & Plan Pilonidal cyst Low back for four days, draining brownish-yellowish-red fluid, initially bloody. No signs of infection or cellulitis. Previous pilonidal cysts present. Risk of recurrence and potential for worsening if untreated. - Referred to surgeon for evaluation and potential excision. - Apply warm compresses 4-6 times daily to promote drainage. - Consider sitz baths or warm water soaks to keep the area soft. - Use pads or a diaper to manage drainage. - Prescribed Bactrim  1 tablet BID for 5 days to prevent infection. - Prescribed Diflucan  to prevent yeast infection due to antibiotic use.  History of obesity Current BMI of 24.3, weight 142 pounds. Previously 162 pounds in April 2025. Currently on Wegovy  0.25 mg weekly with good tolerance and weight loss of 6-7 pounds.  - Continue Wegovy  0.25 mg weekly. - Encouraged lifestyle modifications including physical activity and protein intake.        Follow up plan: Return in about 3 months (around 12/24/2024) for follow up. "

## 2024-09-25 NOTE — Telephone Encounter (Signed)
 Pharmacy Patient Advocate Encounter  Received notification from [facility] that Prior Authorization for Wegovy  0.25MG /0.5ML auto-injectors has been APPROVED from 09/25/24 to 09/25/25. Ran test claim, Copay is $0.00. This test claim was processed through [facility]- copay amounts may vary at other pharmacies due to pharmacy/plan contracts, or as the patient moves through the different stages of their insurance plan.   PA #/Case ID/Reference #: [id number]

## 2024-09-27 ENCOUNTER — Encounter: Payer: Self-pay | Admitting: Nurse Practitioner

## 2024-09-27 DIAGNOSIS — G43009 Migraine without aura, not intractable, without status migrainosus: Secondary | ICD-10-CM

## 2024-09-28 ENCOUNTER — Other Ambulatory Visit: Payer: Self-pay | Admitting: Nurse Practitioner

## 2024-09-28 DIAGNOSIS — G43009 Migraine without aura, not intractable, without status migrainosus: Secondary | ICD-10-CM

## 2024-09-28 MED ORDER — TROKENDI XR 50 MG PO CP24: ORAL_CAPSULE | ORAL | 1 refills | Status: DC

## 2024-09-29 ENCOUNTER — Ambulatory Visit: Admitting: Nurse Practitioner

## 2024-09-29 NOTE — Telephone Encounter (Signed)
 Requested medication (s) are due for refill today: na   Requested medication (s) are on the active medication list: yes  Last refill:  09/28/24 #90 1 refills  Future visit scheduled: no   Notes to clinic:  Pharmacy comment: Product Backordered/Unavailable:THEY DISCONTINUED THE BRAND NAME.      Requested Prescriptions  Pending Prescriptions Disp Refills   Topiramate  ER (TROKENDI  XR) 50 MG CP24 [Pharmacy Med Name: TOPIRAMATE  ER 50 MG CAPSULE]  0    Sig: Please specify directions, refills and quantity     Neurology: Anticonvulsants - topiramate  & zonisamide Passed - 09/29/2024  4:46 PM      Passed - Cr in normal range and within 360 days    Creat  Date Value Ref Range Status  01/09/2024 0.69 0.50 - 0.96 mg/dL Final         Passed - CO2 in normal range and within 360 days    CO2  Date Value Ref Range Status  01/09/2024 23 20 - 32 mmol/L Final         Passed - ALT in normal range and within 360 days    ALT  Date Value Ref Range Status  01/09/2024 13 6 - 29 U/L Final         Passed - AST in normal range and within 360 days    AST  Date Value Ref Range Status  01/09/2024 15 10 - 30 U/L Final         Passed - Completed PHQ-2 or PHQ-9 in the last 360 days      Passed - Valid encounter within last 12 months    Recent Outpatient Visits           4 days ago History of obesity   [facility] [place] Gareth Mliss FALCON, FNP   3 months ago Major depression, recurrent, chronic   [facility] Leavy Mole, PA-C   8 months ago Annual physical exam   [facility] Gareth Mliss FALCON, [place]

## 2024-09-30 ENCOUNTER — Other Ambulatory Visit: Payer: Self-pay | Admitting: Nurse Practitioner

## 2024-09-30 DIAGNOSIS — G43009 Migraine without aura, not intractable, without status migrainosus: Secondary | ICD-10-CM

## 2024-09-30 MED ORDER — TOPIRAMATE 25 MG PO TABS: 25.0000 mg | ORAL_TABLET | Freq: Two times a day (BID) | ORAL | 1 refills | Status: AC

## 2024-10-04 ENCOUNTER — Telehealth

## 2024-10-04 DIAGNOSIS — R1110 Vomiting, unspecified: Secondary | ICD-10-CM

## 2024-10-04 DIAGNOSIS — R109 Unspecified abdominal pain: Secondary | ICD-10-CM

## 2024-10-05 ENCOUNTER — Ambulatory Visit: Payer: Self-pay

## 2024-10-05 NOTE — Telephone Encounter (Signed)
 FYI Only or Action Required?: FYI only for provider: ED advised.  Patient was last seen in primary care on 09/25/2024 by Gareth Mliss FALCON, FNP.  Called Nurse Triage reporting Vomiting.  Symptoms began several days ago.  Interventions attempted: Nothing.  Symptoms are: unchanged.  Triage Disposition: Call EMS 911 Now  Patient/caregiver understands and will follow disposition?: Yes  Reason for Disposition  Shock suspected (e.g., cold/pale/clammy skin, too weak to stand, low BP, rapid pulse)  Answer Assessment - Initial Assessment Questions Vomiting x 3 days. Highest temp 99.5 F. States vomiting is non-stop. Abdominal pain (states entire abdomen, unable to specify quadrant) first 2 days, less severe today. Unable to tolerate fluids or food PO. States feels diaphoretic.  EMS advised, pt stated her mother is on her way home and is agreeable to calling when she arrives. Advised to lay down until then.   1. VOMITING SEVERITY: How many times have you vomited in the past 24 hours?      20-25 2. ONSET: When did the vomiting begin?      3 days 3. FLUIDS: What fluids or food have you vomited up today? Have you been able to keep any fluids down?     Denies 4. ABDOMEN PAIN: Are your having any abdomen pain? If Yes : How bad is it and what does it feel like? (e.g., crampy, dull, intermittent, constant)      Was severe, now mild. 5. DIARRHEA: Is there any diarrhea? If Yes, ask: How many times today?      Denies  Protocols used: Vomiting-A-AH   Message from [place] sent at 10/05/2024  8:15 AM EST  Summary: vomiting   Reason for Triage: Patient has been vomiting since late friday, possible food poisoning, highest temp at 99.4, negavtive covid/flu a / flu b test.  Best callback number: [phone]         Past Medical History:  Diagnosis Date   Allergy    Anxiety    Depression    HA (headache)    Kidney stone    Simple phobia    UTI (urinary tract infection)

## 2024-10-05 NOTE — Progress Notes (Signed)
" °  Because of severe abdominal pain and need to rule out more concerning infections, I feel your condition warrants further evaluation and I recommend that you be seen in a face-to-face visit.   NOTE: There will be NO CHARGE for this E-Visit   If you are having a true medical emergency, please call 911.     For an urgent face to face visit, [facility] has multiple urgent care centers for your convenience.  Click the link below for the full list of locations and hours, walk-in wait times, appointment scheduling options and driving directions:  Urgent Care - [place], [place], [place], [place], [place], [place]  [place]     Your MyChart E-visit questionnaire answers were reviewed by a board certified advanced clinical practitioner to complete your personal care plan based on your specific symptoms.    Thank you for using e-Visits.    "

## 2024-10-05 NOTE — Telephone Encounter (Signed)
 Notified also if  having abdominal no appt. Here to go to Urgent care or ER

## 2024-10-08 ENCOUNTER — Other Ambulatory Visit: Payer: Self-pay | Admitting: Nurse Practitioner

## 2024-10-08 ENCOUNTER — Ambulatory Visit: Payer: Self-pay | Admitting: Nurse Practitioner

## 2024-10-08 ENCOUNTER — Ambulatory Visit: Admitting: Nurse Practitioner

## 2024-10-08 ENCOUNTER — Ambulatory Visit
Admission: RE | Admit: 2024-10-08 | Discharge: 2024-10-08 | Disposition: A | Discharge status: Home / Self Care | Source: Ambulatory Visit | Attending: Nurse Practitioner | Admitting: Nurse Practitioner

## 2024-10-08 VITALS — BP 106/70 | HR 81 | Temp 98.0°F | Ht 64.0 in | Wt 143.0 lb

## 2024-10-08 DIAGNOSIS — R1031 Right lower quadrant pain: Secondary | ICD-10-CM | POA: Insufficient documentation

## 2024-10-08 DIAGNOSIS — R1085 Abdominal pain of multiple sites: Secondary | ICD-10-CM

## 2024-10-08 DIAGNOSIS — K2900 Acute gastritis without bleeding: Secondary | ICD-10-CM

## 2024-10-08 MED ORDER — SUCRALFATE 1 G PO TABS: 1.0000 g | ORAL_TABLET | Freq: Three times a day (TID) | ORAL | 0 refills | Status: AC | PRN

## 2024-10-08 MED ORDER — PANTOPRAZOLE SODIUM 40 MG PO TBEC: 40.0000 mg | DELAYED_RELEASE_TABLET | Freq: Every day | ORAL | 0 refills | Status: AC

## 2024-10-08 MED ORDER — IOHEXOL 300 MG/ML  SOLN
85.0000 mL | Freq: Once | INTRAMUSCULAR | Status: AC | PRN
  Administered 2024-10-08: 85 mL via INTRAVENOUS

## 2024-10-08 MED ORDER — ONDANSETRON 4 MG PO TBDP: 4.0000 mg | ORAL_TABLET | Freq: Three times a day (TID) | ORAL | 0 refills | Status: AC | PRN

## 2024-10-08 MED ORDER — PROMETHAZINE HCL 25 MG PO TABS: 25.0000 mg | ORAL_TABLET | Freq: Four times a day (QID) | ORAL | 0 refills | Status: AC | PRN

## 2024-10-08 NOTE — Progress Notes (Signed)
 "  BP 106/70   Pulse 81   Temp 98 F (36.7 C)   Ht 5' 4 (1.626 m)   Wt 143 lb (64.9 kg)   SpO2 93%   BMI 24.55 kg/m    Subjective:    Patient ID: Tanya Higgins, female    DOB: 09/29/2001, 24 y.o.   MRN: [id number]  HPI: Tanya Higgins is a 24 y.o. female  Chief Complaint  Patient presents with   Nausea    Pt states she has not taken wegovy  in two weeks.    Discussed the use of AI scribe software for clinical note transcription with the patient, who gave verbal consent to proceed.  History of Present Illness Tanya Higgins is a 24 year old female who presents with persistent vomiting and abdominal pain. She is accompanied by her mother.  Gastrointestinal symptoms - Persistent vomiting since Saturday evening, unable to keep food down - Severe abdominal pain - No diarrhea, but infrequent soft stools - Extreme sensitivity to smells and tastes - Significant weight loss of approximately ten pounds - Symptoms began after consuming pizza with a different type of cheese  Systemic symptoms - Hot and cold flashes - Dizziness - Feels 'very out of it' and 'delirious', attributed to not eating and ongoing dehydration  Recent medical interventions and findings - Evaluated in the ER on October 05, 2024 - Received IV fluids, Toradol , and Zofran  in the ER - Lab work revealed leukocytosis (WBC 13.3) and elevated hemoglobin (16.7) - Urinalysis positive for ketones, indicating dehydration - Pregnancy test negative - Discharged with over-the-counter Zofran , which has not been effective  Medication history - Has not taken Wegovy  for two weeks, but symptoms persist  Gynecologic symptoms - Denies any possibility of pregnancy -recent pregnancy test negative -recent ua positive for ketones but negative for leukocytes and nitrites         09/25/2024   10:13 AM 06/08/2024    2:17 PM 01/09/2024   11:04 AM  Depression screen PHQ 2/9  Decreased Interest 1 2 3   Down, Depressed,  Hopeless 3 2 2   PHQ - 2 Score 4 4 5   Altered sleeping 2 0 3  Tired, decreased energy 2 3 3   Change in appetite 1 1 3   Feeling bad or failure about yourself  3 3 3   Trouble concentrating 1 0 1  Moving slowly or fidgety/restless 0 0 1  Suicidal thoughts 3 1 2   PHQ-9 Score 16 12  21    Difficult doing work/chores Very difficult  Somewhat difficult     Data saved with a previous flowsheet row definition    Relevant past medical, surgical, family and social history reviewed and updated as indicated. Interim medical history since our last visit reviewed. Allergies and medications reviewed and updated.  Review of Systems  Ten systems reviewed and is negative except as mentioned in HPI      Objective:      BP 106/70   Pulse 81   Temp 98 F (36.7 C)   Ht 5' 4 (1.626 m)   Wt 143 lb (64.9 kg)   SpO2 93%   BMI 24.55 kg/m    Wt Readings from Last 3 Encounters:  10/08/24 143 lb (64.9 kg)  09/25/24 142 lb (64.4 kg)  06/08/24 159 lb (72.1 kg)    Physical Exam GENERAL: Alert, cooperative, well developed, no acute distress. HEENT: Normocephalic, normal oropharynx, moist mucous membranes. CHEST: Clear to auscultation bilaterally, no wheezes, rhonchi, or crackles. CARDIOVASCULAR:  Normal heart rate and rhythm, S1 and S2 normal without murmurs. ABDOMEN: Generalized abdominal tenderness, specifically in the right lower quadrant. EXTREMITIES: No cyanosis or edema. NEUROLOGICAL: Cranial nerves grossly intact, moves all extremities without gross motor or sensory deficit.  Results for orders placed or performed in visit on 01/09/24  Cytology - PAP   Collection Time: 01/09/24 11:20 AM  Result Value Ref Range   Adequacy      Satisfactory for evaluation; transformation zone component ABSENT.   Diagnosis      - Negative for intraepithelial lesion or malignancy (NILM)  CBC with Differential/Platelet   Collection Time: 01/09/24 11:42 AM  Result Value Ref Range   WBC 6.8 3.8 - 10.8  Thousand/uL   RBC 4.98 3.80 - 5.10 Million/uL   Hemoglobin 13.3 11.7 - 15.5 g/dL   HCT 58.9 64.9 - 54.9 %   MCV 82.3 80.0 - 100.0 fL   MCH 26.7 (L) 27.0 - 33.0 pg   MCHC 32.4 32.0 - 36.0 g/dL   RDW 85.0 88.9 - 84.9 %   Platelets 265 140 - 400 Thousand/uL   MPV 10.6 7.5 - 12.5 fL   Neutro Abs 3,808 1,500 - 7,800 cells/uL   Absolute Lymphocytes 2,346 850 - 3,900 cells/uL   Absolute Monocytes 517 200 - 950 cells/uL   Eosinophils Absolute 102 15 - 500 cells/uL   Basophils Absolute 27 0 - 200 cells/uL   Neutrophils Relative % 56 %   Total Lymphocyte 34.5 %   Monocytes Relative 7.6 %   Eosinophils Relative 1.5 %   Basophils Relative 0.4 %  Comprehensive metabolic panel with GFR   Collection Time: 01/09/24 11:42 AM  Result Value Ref Range   Glucose, Bld 98 65 - 139 mg/dL   BUN 10 7 - 25 mg/dL   Creat 9.30 9.49 - 9.03 mg/dL   eGFR 872 > OR = 60 fO/fpw/8.26f7   BUN/Creatinine Ratio SEE NOTE: 6 - 22 (calc)   Sodium 141 135 - 146 mmol/L   Potassium 4.5 3.5 - 5.3 mmol/L   Chloride 107 98 - 110 mmol/L   CO2 23 20 - 32 mmol/L   Calcium 9.3 8.6 - 10.2 mg/dL   Total Protein 6.6 6.1 - 8.1 g/dL   Albumin 4.4 3.6 - 5.1 g/dL   Globulin 2.2 1.9 - 3.7 g/dL (calc)   AG Ratio 2.0 1.0 - 2.5 (calc)   Total Bilirubin 0.5 0.2 - 1.2 mg/dL   Alkaline phosphatase (APISO) 140 (H) 31 - 125 U/L   AST 15 10 - 30 U/L   ALT 13 6 - 29 U/L  Hemoglobin A1c   Collection Time: 01/09/24 11:42 AM  Result Value Ref Range   Hgb A1c MFr Bld 5.1 <5.7 % of total Hgb   Mean Plasma Glucose 100 mg/dL   eAG (mmol/L) 5.5 mmol/L  Hepatitis C antibody   Collection Time: 01/09/24 11:42 AM  Result Value Ref Range   Hepatitis C Ab NON-REACTIVE NON-REACTIVE  HIV Antibody (routine testing w rflx)   Collection Time: 01/09/24 11:42 AM  Result Value Ref Range   HIV 1&2 Ab, 4th Generation NON-REACTIVE NON-REACTIVE  Lipid panel   Collection Time: 01/09/24 11:42 AM  Result Value Ref Range   Cholesterol 184 <200 mg/dL   HDL  41 (L) > OR = 50 mg/dL   Triglycerides 831 (H) <150 mg/dL   LDL Cholesterol (Calc) 115 (H) mg/dL (calc)   Total CHOL/HDL Ratio 4.5 <5.0 (calc)   Non-HDL Cholesterol (Calc) 143 (H) <130  mg/dL (calc)          Assessment & Plan:   Problem List Items Addressed This Visit   None Visit Diagnoses       Abdominal pain of multiple sites    -  Primary     Right lower quadrant abdominal pain       Relevant Orders   CBC with Differential/Platelet   Comprehensive metabolic panel with GFR   Lipase, blood   CT ABDOMEN PELVIS W CONTRAST        Assessment and Plan Assessment & Plan Acute abdominal pain and vomiting, rule out appendicitis or other intra-abdominal pathology Acute onset of vomiting and abdominal pain since Saturday evening, with extreme abdominal pain and generalized tenderness, particularly in the right lower quadrant. Previous ER visit diagnosed gastroenteritis with leukocytosis and dehydration. Current symptoms include dizziness, weakness, and weight loss. No diarrhea, only soft stools. Zofran  is ineffective. No recent Wegovy  use. Possible food-related etiology, but atypical presentation without diarrhea. Sensitivity to smells and tastes noted. - Repeated STAT labs to assess current status  Dehydration secondary to vomiting Dehydration secondary to persistent vomiting since Saturday evening. Previous ER visit showed positive ketones in urine, indicating dehydration. Symptoms include dizziness, weakness, and weight loss. No significant improvement with Zofran .        Follow up plan: Return if symptoms worsen or fail to improve. "

## 2024-10-09 ENCOUNTER — Encounter: Payer: Self-pay | Admitting: Nurse Practitioner

## 2024-10-14 ENCOUNTER — Other Ambulatory Visit: Payer: Self-pay | Admitting: Nurse Practitioner

## 2024-10-14 DIAGNOSIS — R1085 Abdominal pain of multiple sites: Secondary | ICD-10-CM

## 2024-10-14 DIAGNOSIS — R112 Nausea with vomiting, unspecified: Secondary | ICD-10-CM

## 2024-10-20 NOTE — Progress Notes (Unsigned)
 "   10/20/2024 Tanya Higgins [id number] 2001-02-19  Gastroenterology Office Note    Referring Provider: Gareth Mliss FALCON, FNP Primary Care Physician:  Gareth Mliss FALCON, FNP  Primary GI Provider: Celestia Rima, NP; Jinny Carmine, MD    Chief Complaint   No chief complaint on file.    History of Present Illness   Tanya Higgins is a 24 y.o. female with PMHX of obesity presenting today at the request of Gareth Mliss FALCON, FNP due to abdominal pain.  Patient seen at [facility] emergency department on 10/09/2024 for continued nausea and vomiting.  Patient is a cannabis user.  WBC 12.8; lipase WNL  Patient seen by PCP on 10/08/2024 for persistent vomiting abdominal pain. CT abdomen pelvis: Unremarkable, no acute findings  Past Medical History:  Diagnosis Date   Allergy    Anxiety    Depression    HA (headache)    Kidney stone    Simple phobia    UTI (urinary tract infection)     Past Surgical History:  Procedure Laterality Date   FRACTURE SURGERY      Current Outpatient Medications  Medication Sig Dispense Refill   busPIRone  (BUSPAR ) 15 MG tablet Take 1 tablet (15 mg total) by mouth 2 (two) times daily. 180 tablet 0   cariprazine  (VRAYLAR ) 1.5 MG capsule TAKE 1 CAPSULE BY MOUTH DAILY. (Patient taking differently: Take 3 mg by mouth daily.) 90 capsule 1   desvenlafaxine (PRISTIQ) 25 MG 24 hr tablet Take 25 mg by mouth daily.     fluconazole  (DIFLUCAN ) 150 MG tablet Take 1 tablet (150 mg total) by mouth every 3 (three) days as needed (for vaginal itching/yeast infection sx). 2 tablet 0   lamoTRIgine (LAMICTAL) 25 MG tablet Take by mouth.     pantoprazole  (PROTONIX ) 40 MG tablet Take 1 tablet (40 mg total) by mouth daily. 30 tablet 0   promethazine  (PHENERGAN ) 25 MG tablet Take 1 tablet (25 mg total) by mouth every 6 (six) hours as needed for nausea or vomiting. 30 tablet 0   propranolol (INDERAL) 10 MG tablet Take 10-20 mg by mouth daily as needed.     sucralfate  (CARAFATE ) 1 g  tablet Take 1 tablet (1 g total) by mouth 3 (three) times daily with meals as needed. for GERD/reflux/epigastric pain when eating or drinking 90 tablet 0   topiramate  (TOPAMAX ) 25 MG tablet Take 1 tablet (25 mg total) by mouth 2 (two) times daily. 180 tablet 1   WEGOVY  0.25 MG/0.5ML SOAJ SQ injection Inject 0.25 mg into the skin once a week. 9 mL 3   No current facility-administered medications for this visit.    Allergies as of 10/21/2024   (No Known Allergies)    Family History  Problem Relation Age of Onset   Depression Mother    Hypertension Mother    Asthma Mother    Migraines Mother    Diabetes Mother    Alcohol abuse Father    Diabetes Sister     Social History   Socioeconomic History   Marital status: Single    Spouse name: Not on file   Number of children: Not on file   Years of education: Not on file   Highest education level: Some college, no degree  Occupational History   Occupation: student   Tobacco Use   Smoking status: Never    Passive exposure: Yes   Smokeless tobacco: Never   Tobacco comments:    i havent smoked a cigarette in months,  didnt like it  Vaping Use   Vaping status: Never Used  Substance and Sexual Activity   Alcohol use: No   Drug use: No   Sexual activity: Not Currently    Birth control/protection: None  Other Topics Concern   Not on file  Social History Narrative   Tanya Higgins is a 12 th grade student.   She attends [employer], but virtually since 10/2018    She lives with both parents. She has one sister.   Social Drivers of Health   Tobacco Use: Medium Risk (10/09/2024)   Received from [facility]   Patient History    Smoking Tobacco Use: Former    Smokeless Tobacco Use: Never    Passive Exposure: Past  Physicist, Medical Strain: Patient Declined (06/05/2024)   Overall Financial Resource Strain (CARDIA)    Difficulty of Paying Living Expenses: Patient declined  Food Insecurity: No Food Insecurity (06/05/2024)   Epic     Worried About Programme Researcher, Broadcasting/film/video in the Last Year: Never true    Ran Out of Food in the Last Year: Never true  Transportation Needs: No Transportation Needs (06/05/2024)   Epic    Lack of Transportation (Medical): No    Lack of Transportation (Non-Medical): No  Physical Activity: Insufficiently Active (06/05/2024)   Exercise Vital Sign    Days of Exercise per Week: 2 days    Minutes of Exercise per Session: 30 min  Stress: Stress Concern Present (06/05/2024)   [employer] of Occupational Health - Occupational Stress Questionnaire    Feeling of Stress: Rather much  Social Connections: Socially Isolated (06/05/2024)   Social Connection and Isolation Panel    Frequency of Communication with Friends and Family: Once a week    Frequency of Social Gatherings with Friends and Family: Never    Attends Religious Services: Never    Database Administrator or Organizations: No    Attends Engineer, Structural: Not on file    Marital Status: Never married  Intimate Partner Violence: Not At Risk (01/09/2024)   Humiliation, Afraid, Rape, and Kick questionnaire    Fear of Current or Ex-Partner: No    Emotionally Abused: No    Physically Abused: No    Sexually Abused: No  Depression (PHQ2-9): High Risk (09/25/2024)   Depression (PHQ2-9)    PHQ-2 Score: 16  Alcohol Screen: Low Risk (06/05/2024)   Alcohol Screen    Last Alcohol Screening Score (AUDIT): 0  Housing: High Risk (06/05/2024)   Epic    Unable to Pay for Housing in the Last Year: No    Number of Times Moved in the Last Year: 3    Homeless in the Last Year: No  Utilities: Low Risk (08/12/2023)   Received from [facility]   Utilities    Within the past 12 months, have you been unable to get utilities(heat, electricity) when it was really needed?: No  Health Literacy: Low Risk (08/17/2023)   Received from [facility] Literacy    How often do you need to have someone help you when you read instructions,  pamphlets, or other written material from your doctor or pharmacy?: Never     RELEVANT GI HISTORY, IMAGING AND LABS: CBC    Component Value Date/Time   WBC 6.8 01/09/2024 1142   RBC 4.98 01/09/2024 1142   HGB 13.3 01/09/2024 1142   HCT 41.0 01/09/2024 1142   PLT 265 01/09/2024 1142   MCV 82.3  01/09/2024 1142   MCH 26.7 (L) 01/09/2024 1142   MCHC 32.4 01/09/2024 1142   RDW 14.9 01/09/2024 1142   LYMPHSABS 1,908 01/10/2022 1615   EOSABS 102 01/09/2024 1142   BASOSABS 27 01/09/2024 1142   Recent Labs    01/09/24 1142  HGB 13.3    CMP     Component Value Date/Time   NA 141 01/09/2024 1142   K 4.5 01/09/2024 1142   CL 107 01/09/2024 1142   CO2 23 01/09/2024 1142   GLUCOSE 98 01/09/2024 1142   BUN 10 01/09/2024 1142   CREATININE 0.69 01/09/2024 1142   CALCIUM 9.3 01/09/2024 1142   PROT 6.6 01/09/2024 1142   ALBUMIN 4.4 10/30/2020 0925   AST 15 01/09/2024 1142   ALT 13 01/09/2024 1142   ALKPHOS 78 10/30/2020 0925   BILITOT 0.5 01/09/2024 1142   GFRNONAA >60 10/30/2020 0925   GFRAA NOT CALCULATED 01/10/2020 1055      Latest Ref Rng & Units 01/09/2024   11:42 AM 01/10/2022    4:15 PM 10/30/2020    9:25 AM  Hepatic Function  Total Protein 6.1 - 8.1 g/dL 6.6  5.8  6.4   Albumin 3.5 - 5.0 g/dL   4.4   AST 10 - 30 U/L 15  13  13    ALT 6 - 29 U/L 13  11  13    Alk Phosphatase 38 - 126 U/L   78   Total Bilirubin 0.2 - 1.2 mg/dL 0.5  0.6  0.6       Review of Systems   All systems reviewed and negative except where noted in HPI.    Physical Exam  LMP 09/22/2024 (Exact Date)  Patient's last menstrual period was 09/22/2024 (exact date). General:   Alert and oriented. Pleasant and cooperative. Well-nourished and well-developed. In no acute distress.  Head:  Normocephalic and atraumatic. Eyes:  Without icterus Ears:  Normal auditory acuity. Neck:  Supple; no masses or thyromegaly. Lungs:  Respirations even and unlabored.  Clear throughout to auscultation.   No  wheezes, crackles, or rhonchi. No acute distress. Heart:  Regular rate and rhythm; no murmurs, clicks, rubs, or gallops. Abdomen:  Normal bowel sounds.  No bruits.  Soft, non-tender and non-distended without masses, hepatosplenomegaly or hernias noted.  No guarding or rebound tenderness.  ***Negative Carnett sign.   Rectal:  Deferred.***  Msk:  Symmetrical without gross deformities. Normal posture. Extremities:  Without edema. Neurologic:  Alert and  oriented x4;  grossly normal neurologically. Skin:  Intact without significant lesions or rashes. Psych:  Alert and cooperative. Normal mood and affect.   Assessment & Plan   Tanya Higgins is a 24 y.o. female presenting today with    I discussed the assessment and treatment plan with the patient. The patient was provided an opportunity to ask questions and all were answered. The patient agreed with the plan and demonstrated an understanding of the instructions.   The patient was advised to call back or seek an in-person evaluation if the symptoms worsen or if the condition fails to improve as anticipated.  Grayce Bohr, DNP, AGNP-C [facility] Gastroenter[facility]  "

## 2024-10-21 ENCOUNTER — Ambulatory Visit: Admitting: Family Medicine

## 2024-10-21 ENCOUNTER — Telehealth: Payer: Self-pay

## 2024-10-21 NOTE — Telephone Encounter (Signed)
 The patient called to cancel her appointment due to illness in the household, stating that her entire household has the flu.

## 2024-10-27 ENCOUNTER — Ambulatory Visit: Admitting: General Surgery

## 2024-11-05 ENCOUNTER — Ambulatory Visit: Admitting: General Surgery

## 2025-01-11 ENCOUNTER — Encounter: Admitting: Family Medicine
